# Patient Record
Sex: Female | Born: 1965
Health system: Western US, Academic
[De-identification: ages and names within clinical notes are randomized; demographics above are authoritative.]

---

## 2012-11-19 DIAGNOSIS — N390 Urinary tract infection, site not specified: Secondary | ICD-10-CM

## 2015-12-21 DIAGNOSIS — R61 Generalized hyperhidrosis: Secondary | ICD-10-CM

## 2017-05-01 ENCOUNTER — Ambulatory Visit: Payer: BLUE CROSS/BLUE SHIELD

## 2017-05-04 ENCOUNTER — Ambulatory Visit: Payer: BLUE CROSS/BLUE SHIELD

## 2017-05-04 DIAGNOSIS — M519 Unspecified thoracic, thoracolumbar and lumbosacral intervertebral disc disorder: Secondary | ICD-10-CM

## 2017-05-04 DIAGNOSIS — Z0000 Encounter for general adult medical examination without abnormal findings: Secondary | ICD-10-CM

## 2017-05-04 DIAGNOSIS — J309 Allergic rhinitis, unspecified: Secondary | ICD-10-CM

## 2017-05-04 DIAGNOSIS — R05 Cough: Secondary | ICD-10-CM

## 2017-05-04 MED ORDER — AZELASTINE-FLUTICASONE 137-50 MCG/ACT NA SUSP
1 | Freq: Two times a day (BID) | NASAL | 3 refills | Status: AC
Start: 2017-05-04 — End: 2018-12-02

## 2017-05-04 MED ORDER — AZITHROMYCIN 250 MG PO TABS
ORAL_TABLET | 0 refills | Status: AC
Start: 2017-05-04 — End: ?

## 2017-05-05 ENCOUNTER — Ambulatory Visit: Payer: BLUE CROSS/BLUE SHIELD

## 2017-05-05 ENCOUNTER — Telehealth: Payer: PRIVATE HEALTH INSURANCE

## 2017-05-05 ENCOUNTER — Telehealth: Payer: BLUE CROSS/BLUE SHIELD

## 2017-05-05 DIAGNOSIS — J321 Chronic frontal sinusitis: Secondary | ICD-10-CM

## 2017-05-06 ENCOUNTER — Ambulatory Visit: Payer: BLUE CROSS/BLUE SHIELD

## 2017-05-06 ENCOUNTER — Ambulatory Visit: Payer: PRIVATE HEALTH INSURANCE

## 2017-05-06 DIAGNOSIS — Z283 Underimmunization status: Secondary | ICD-10-CM

## 2017-05-06 MED ORDER — HYDROCODONE-ACETAMINOPHEN 5-325 MG PO TABS
1 | ORAL_TABLET | Freq: Four times a day (QID) | ORAL | 0 refills | Status: AC | PRN
Start: 2017-05-06 — End: ?

## 2017-05-15 ENCOUNTER — Telehealth: Payer: BLUE CROSS/BLUE SHIELD

## 2017-08-03 MED ORDER — MELOXICAM 15 MG PO TABS
ORAL_TABLET | 0 refills | Status: AC
Start: 2017-08-03 — End: 2018-12-30

## 2017-10-01 ENCOUNTER — Telehealth: Payer: PRIVATE HEALTH INSURANCE

## 2017-10-01 NOTE — Telephone Encounter
Forwarded by: Shalina Norfolk MURILLO RON

## 2017-10-01 NOTE — Telephone Encounter
Orders Request    What is being requested? (Tests, Labs, Imaging, etc.): [place]    Reason for the request: Per Christoper FabianGriselda in [facility] is requesting the right breast US  States that she is diagnostic  Pt is currently at the office    Where does the patient want to be seen? [facility]     [place], what is the fax number to the facility?    phone # [phone]  fax # [phone]      Has the patient seen their doctor for this matter? n    Last office visit: 05/04/2017    Patient was offered an appointment but declined.    Patient has been notified of the 24-48 hour turnaround time.

## 2017-10-07 ENCOUNTER — Ambulatory Visit: Payer: BLUE CROSS/BLUE SHIELD

## 2017-10-07 DIAGNOSIS — N63 Unspecified lump in unspecified breast: Secondary | ICD-10-CM

## 2017-10-07 NOTE — Telephone Encounter
HI Dr. Lewie ChamberHammer,     can you place the order so I can fax [facility]?

## 2017-10-07 NOTE — Telephone Encounter
Reply by: Helene Shoeerence M. Daron Stutz   breast US request done

## 2017-10-07 NOTE — Telephone Encounter
I called the pt and left her a voicemail. Order was fax to the fax number that was provided below. Thank you.    Fax# [phone]  @ 2:09pm  Total pgs: 2

## 2017-10-07 NOTE — Telephone Encounter
Reply by: Helene Shoeerence M. Jakeya Gherardi  Do we still need to approved us OF BRE[facility]>

## 2017-12-01 ENCOUNTER — Ambulatory Visit: Payer: PRIVATE HEALTH INSURANCE

## 2017-12-01 DIAGNOSIS — B001 Herpesviral vesicular dermatitis: Secondary | ICD-10-CM

## 2017-12-02 LAB — HSV Type 1 and 2 by PCR: HSV TYPE 1 DNA: NOT DETECTED

## 2017-12-02 MED ORDER — MOMETASONE FUROATE 0.1 % EX OINT
0 refills | Status: AC
Start: 2017-12-02 — End: 2018-12-25

## 2017-12-02 MED ORDER — VALACYCLOVIR HCL 500 MG PO TABS
500 mg | ORAL_TABLET | Freq: Two times a day (BID) | ORAL | 0 refills | Status: AC
Start: 2017-12-02 — End: ?

## 2017-12-02 NOTE — Progress Notes
PATIENTDeandrea Quinn  MRN: [id number]  DOB: 08/13/1966  DATE OF SERVICE: 12/01/2017    Chief Complaint   Patient presents with   ??? Mouth Lesions     Past Medical History:   Diagnosis Date   ??? Anxiety    ??? Degenerative arthritis        HPI:   Janice Quinn is a very pleasant 52 y.o. female with the above history who presents for the following:     Cold sore  -started 5 days ago   -painful, looked like a ''pus-filled pimple''. Difficult to floss    -was icing and applying OTC abrevia ointment which helped a little.   -no associated symptoms. Afebrile      CHRONIC CONDITIONS     1.  []  Stable  []  Improved  []  Worsened   2.  []  Stable  []  Improved  []  Worsened   3.   []  Stable  []  Improved  []  Worsened     Past History:     Past Medical History:   Diagnosis Date   ??? Anxiety    ??? Degenerative arthritis      Past Surgical History:   Procedure Laterality Date   ??? HYSTERECTOMY         Medications:     Current Outpatient Prescriptions   Medication Sig   ??? azelastine-fluticasone (DYMISTA) 137-50 mcg/act nasal spray 1 spray by Both Nares route two (2) times daily.   ??? gabapentin 300 mg capsule at bedtime.   ??? hydrocodone-acetaminophen 5-325 mg tablet Take 1 tablet by mouth every six (6) hours as needed for Mild Pain. Max Daily Amount: 4 tablets   ??? MELOXICAM 15 mg tablet TAKE 1 TABLET BY MOUTH DAILY AS NEEDED   ??? MOMETASONE 0.1% ointment APPLY EXTERNALLY TO THE AFFECTED AREA DAILY   ??? ALPRAZOLAM 0.25 mg tablet TAKE 1 TABLET BY MOUTH THREE TIMES DAILY AS NEEDED FOR ANXIETY (Patient not taking: Reported on 05/04/2017)   ??? azithromycin 250 mg tablet Take 2 tablets (500 mg) on  Day 1,  followed by 1 tablet (250 mg) once daily on Days 2 through 5.. (Patient not taking: Reported on 12/01/2017.)     No current facility-administered medications for this visit.      No Known Allergies    Family History:     Family History   Problem Relation Age of Onset   ??? Hypertension Mother    ??? Breast cancer Mother        Social History: Social History   Substance Use Topics   ??? Smoking status: Never Smoker   ??? Smokeless tobacco: Never Used   ??? Alcohol use No     Social History     Social History Narrative    Former smoker, in college for 2-3 years    Social drinking      OB History     No data available        No LMP recorded. Patient has had a hysterectomy.  History   Sexual Activity   ??? Sexual activity: Not on file       Review of Systems:   (Note + findings):     [x]  14-point ROS performed and is negative except as per HPI.    []  Not Obtainable:   []  Constit  []  Skin    []  Eyes  []  CV   []  MS    []  ENT  []  GI  []  Heme/Lymph     []  Resp  []   GU  []  Neuro    []  Imm/All   []  Endo  []  Psych       Physical Exam:   Check if Normal, or note + findings  Vit BP 107/79  ~ Pulse 61  ~ Temp 36.6 ???C (97.9 ???F) (Oral)  ~ Resp 18  ~ Ht 5' 5'' (1.651 m)  ~ Wt 160 lb (72.6 kg) Comment: per pt ~ SpO2 95%  ~ BMI 26.63 kg/m???    BP Readings from Last 3 Encounters:   12/01/17 107/79   05/04/17 108/76   12/05/16 125/85     Wt Readings from Last 3 Encounters:   12/01/17 160 lb (72.6 kg)   05/04/17 158 lb 3.2 oz (71.8 kg)   12/05/16 156 lb (70.8 kg)      Const [x]  NAD  GI []  Abd  []  Rect []  HSM   Eyes []  Conj/Lids []  Pupils  []  Masses []  Guard/Rebnd   ENMT []  Ears/otosc    []  Oroph    1cm erythematous papule on the L inferior lip, adjacent to the lateral commissure w/o pus or drainage   []  Nasal Muc  []  Hearing GU:Fem  KG:MWNU []  Ext   []  Vag      []  Pen  []  Scro    []  Cerv []  Uter/Ad []  Deferred  []  Prost []  Deferred   Neck []  Inspect/Palp   []  Thyroid Neuro [x]  A&O []  CN2-12   Breast []  Inspect   []  Palpation  []  DTR []  Motor/Sen   Resp [x]  Effort [x]  Ascult []  Percuss MSK [x]  Gait   []  ROM  []  Tone  []  Bal []  Insp/palp  []  Back   CV [x]  Auscultate []  Carotids       [x]  Edema Puls: [x]  Post Tib  Skin [x]  Inspect   []  Palp   Lymph []  Neck  []  Axillary  []   Femoral Psych [x]  Insight/judg  [x]  Affect [x]  Cog       Labs/Imaging: I have   [] reviewed radiology,  [x] reviewed labs, [] reviewed diag med test, [x] reviewed & summarized old records, [] requested outside medical records.    Lab Results   Component Value Date    WBC 5.92 12/14/2015    HGB 13.9 12/14/2015    HCT 42.3 12/14/2015    MCV 90.8 12/14/2015    PLT 277 12/14/2015     Lab Results   Component Value Date    CREAT 0.72 12/14/2015    BUN 17 12/14/2015    NA 144 12/14/2015    K 4.5 12/14/2015    CL 99 12/14/2015    CO2 26 12/14/2015     No results found for: HGBA1C  Lab Results   Component Value Date    ALT 13 12/14/2015    AST 23 12/14/2015    ALKPHOS 46 12/14/2015    BILITOT 0.5 12/14/2015     Lab Results   Component Value Date    TSH 1.7 12/14/2015     Lab Results   Component Value Date    CALCIUM 9.8 12/14/2015     No results found for: CHOL, CHOLHDL, CHOLDLCAL, CHOLDLQ, TRIGLY    Assessment and Plan:   Janice Quinn is a very pleasant 52 y.o. female with:     #Herpes Labialis - New   -valacyclovir 500mg  PO BID for 3 days   -counseled on stress reduction   -recommend lysine supplement QD   -f/u swab     There are no diagnoses linked to this  encounter.    Patient Active Problem List    Diagnosis Date Noted   ??? Breast lump in female 10/07/2017   ??? Immunization deficiency 05/06/2017   ??? Healthcare maintenance 05/04/2017   ??? Lumbar disc disease 05/04/2017   ??? Cough 05/04/2017   ??? Night sweats 12/21/2015   ??? Onychomycosis 12/14/2015   ??? Allergic rhinitis 10/17/2014   ??? Low back pain 02/16/2014     IMO Update     ??? ROM (right otitis media) 10/17/2013   ??? Sinusitis 11/19/2012   ??? Weight gain, abnormal 11/19/2012   ??? Lower urinary tract infectious disease 11/19/2012     IMO Update           Health Care Maintenance:   Health Maintenance   Topic Date Due   ??? HIV Screening  11/06/1984   ??? Cervical Ca Screening: HPV Testing  11/06/1996   ??? Cervical Ca Screening: PAP Smear  11/06/1996   ??? Breast Ca Screening: MAMMOGRAM  11/06/2006   ??? Annual Preventive Wellness Visit  11/06/2016 ??? Colon Ca Screening: COLONOSCOPY  11/06/2016   ??? Influenza Vaccine (1) 06/13/2017   ??? Tdap/Td Vaccine (2 - Td) 05/05/2027       Vaccinations   Immunization History   Administered Date(s) Administered   ??? Tdap 05/04/2017   ??? Typhoid Live, oral 05/04/2017       Cancer Screening  Colonoscopy ??? Age 68 (Current age: 52 y.o.)    Preventive Health  A1c ??? No results found for: HGBA1C  Lipids - 2013 ACC/AHA guidelines recommend recommends that patient is not in statin benefit group. Encourage adherence to heart-healthy lifestyle.    10-year ASCVD risk  cannot be calculated because at least one required variable is not available in [facility]  as of 4:33 PM on 12/01/2017  Values used to calculate ASCVD score:  Age: 52 y.o.   Gender: Female Race: Not African American.  Cannot calculate risk because HDL cholesterol not documented within the past 5 years.    Cannot calculate risk because Total cholesterol not documented within the past 5 years.    LDL cholesterol not documented within the past 5 years.    Systolic BP: 107 mm Hg. BP was measured today.  The patient is not being treated with a medication that influences SBP.  The patient is currently not a smoker.  The patient does not have a diagnosis of diabetes.  No results found for: CHOL, CHOLHDL, CHOLDLCAL, CHOLDLQ, TRIGLY  HIV:     Hepatitis C:     There are no Patient Instructions on file for this visit.    The above recommendation were discussed with the patient.  The patient has all questions answered satisfactorily and is in agreement with this recommended plan of care.    The above note was composed with the help of  [facility]    Author:  Kandis Mannan. Earnestene Angello, MD 12/01/2017

## 2017-12-02 NOTE — Patient Instructions
Lysine  - Solgar   Daily

## 2017-12-18 ENCOUNTER — Ambulatory Visit: Payer: BLUE CROSS/BLUE SHIELD

## 2017-12-18 ENCOUNTER — Telehealth: Payer: BLUE CROSS/BLUE SHIELD

## 2017-12-18 DIAGNOSIS — Z0000 Encounter for general adult medical examination without abnormal findings: Secondary | ICD-10-CM

## 2017-12-18 NOTE — Telephone Encounter
Please encourage an appt. Ok to [employer] for today.   Let her know its best to look at it and also we may draw some blood today to figure it out.     Thanks.   ms

## 2017-12-18 NOTE — Telephone Encounter
Forwarded by: Kailei Cowens MURILLO RON

## 2017-12-18 NOTE — Telephone Encounter
Pt is coming @ 4:30 pm today.

## 2017-12-18 NOTE — Telephone Encounter
Call Back Request    MD:  Dr. Sherrilee Gilles    Reason for call back: patient would like to speak with Dr. Sherrilee Gilles regarding her mouth sores. She stated she has another one on the other side of her lip.     Any Symptoms:  []  Yes  [x]  No      ? If yes, what symptoms are you experiencing:    o Duration of symptoms (how long):    o Have you taken medication for symptoms (OTC or Rx):      Patient or caller has been notified of the 24-48 hour turnaround time. yes

## 2017-12-20 DIAGNOSIS — L989 Disorder of the skin and subcutaneous tissue, unspecified: Secondary | ICD-10-CM

## 2017-12-21 ENCOUNTER — Ambulatory Visit: Payer: PRIVATE HEALTH INSURANCE

## 2017-12-21 NOTE — Progress Notes
PATIENTBerkeley Quinn  MRN: [id number]  DOB: 09-05-1966  DATE OF SERVICE: 12/18/2017    Chief Complaint   Patient presents with   ??? Mouth Lesions     Past Medical History:   Diagnosis Date   ??? Anxiety    ??? Degenerative arthritis        HPI:   Janice Quinn is a very pleasant 52 y.o. female with the above history who presents for the following:     Lesion near lip.     Prior healed on left now on right side.   -started 2 days ago   -painful, looked like a ''pus-filled pimple''. Difficult to floss    -no associated symptoms. Afebrile    No chest pain, shortness of breathe, dizziness, syncope, presyncope, abdominal pain, bowel or bladder dysfunction, headache, weakness.          CHRONIC CONDITIONS     1.  []  Stable  []  Improved  []  Worsened   2.  []  Stable  []  Improved  []  Worsened   3.   []  Stable  []  Improved  []  Worsened     Past History:     Past Medical History:   Diagnosis Date   ??? Anxiety    ??? Degenerative arthritis      Past Surgical History:   Procedure Laterality Date   ??? HYSTERECTOMY         Medications:     Current Outpatient Prescriptions   Medication Sig   ??? ALPRAZOLAM 0.25 mg tablet TAKE 1 TABLET BY MOUTH THREE TIMES DAILY AS NEEDED FOR ANXIETY (Patient not taking: Reported on 05/04/2017)   ??? azelastine-fluticasone (DYMISTA) 137-50 mcg/act nasal spray 1 spray by Both Nares route two (2) times daily.   ??? azithromycin 250 mg tablet Take 2 tablets (500 mg) on  Day 1,  followed by 1 tablet (250 mg) once daily on Days 2 through 5.. (Patient not taking: Reported on 12/01/2017.)   ??? gabapentin 300 mg capsule at bedtime.   ??? hydrocodone-acetaminophen 5-325 mg tablet Take 1 tablet by mouth every six (6) hours as needed for Mild Pain. Max Daily Amount: 4 tablets   ??? MELOXICAM 15 mg tablet TAKE 1 TABLET BY MOUTH DAILY AS NEEDED   ??? mometasone 0.1% ointment APPLY EXTERNALLY TO THE AFFECTED AREA DAILY.     No current facility-administered medications for this visit.      No Known Allergies    Family History: Family History   Problem Relation Age of Onset   ??? Hypertension Mother    ??? Breast cancer Mother        Social History:     Social History   Substance Use Topics   ??? Smoking status: Never Smoker   ??? Smokeless tobacco: Never Used   ??? Alcohol use No     Social History     Social History Narrative    Former smoker, in college for 2-3 years    Social drinking      OB History     No data available        No LMP recorded. Patient has had a hysterectomy.  History   Sexual Activity   ??? Sexual activity: Not on file       Review of Systems:   (Note + findings):     [x]  14-point ROS performed and is negative except as per HPI.    []  Not Obtainable:   []  Constit  []  Skin    []   Eyes  []  CV   []  MS    []  ENT  []  GI  []  Heme/Lymph     []  Resp  []  GU  []  Neuro    []  Imm/All   []  Endo  []  Psych       Physical Exam:   Check if Normal, or note + findings  Vit BP 137/89  ~ Pulse 57  ~ Temp 37.2 ???C (98.9 ???F) (Oral)  ~ Resp 16  ~ Ht 5' 5'' (1.651 m)  ~ Wt 160 lb (72.6 kg)  ~ SpO2 97%  ~ BMI 26.63 kg/m???    BP Readings from Last 3 Encounters:   12/18/17 137/89   12/01/17 107/79   05/04/17 108/76     Wt Readings from Last 3 Encounters:   12/18/17 160 lb (72.6 kg)   12/01/17 160 lb (72.6 kg)   05/04/17 158 lb 3.2 oz (71.8 kg)      Const [x]  NAD  GI []  Abd  []  Rect []  HSM   Eyes []  Conj/Lids []  Pupils  []  Masses []  Guard/Rebnd   ENMT []  Ears/otosc    []  Oroph    Healing lesion. , adjacent to the lateral commissure w/o pus or drainage   []  Nasal Muc  []  Hearing GU:Fem  ZO:XWRU []  Ext   []  Vag      []  Pen  []  Scro    []  Cerv []  Uter/Ad []  Deferred  []  Prost []  Deferred   Neck []  Inspect/Palp   []  Thyroid Neuro [x]  A&O []  CN2-12   Breast []  Inspect   []  Palpation  []  DTR []  Motor/Sen   Resp [x]  Effort [x]  Ascult []  Percuss MSK [x]  Gait   []  ROM  []  Tone  []  Bal []  Insp/palp  []  Back   CV [x]  Auscultate []  Carotids       [x]  Edema Puls: [x]  Post Tib  Skin [x]  Inspect   []  Palp Lymph []  Neck  []  Axillary  []   Femoral Psych [x]  Insight/judg  [x]  Affect [x]  Cog       Labs/Imaging:   I have   [] reviewed radiology,  [x] reviewed labs, [] reviewed diag med test, [x] reviewed & summarized old records, [] requested outside medical records.    Lab Results   Component Value Date    WBC 5.92 12/14/2015    HGB 13.9 12/14/2015    HCT 42.3 12/14/2015    MCV 90.8 12/14/2015    PLT 277 12/14/2015     Lab Results   Component Value Date    CREAT 0.72 12/14/2015    BUN 17 12/14/2015    NA 144 12/14/2015    K 4.5 12/14/2015    CL 99 12/14/2015    CO2 26 12/14/2015     No results found for: HGBA1C  Lab Results   Component Value Date    ALT 13 12/14/2015    AST 23 12/14/2015    ALKPHOS 46 12/14/2015    BILITOT 0.5 12/14/2015     Lab Results   Component Value Date    TSH 1.7 12/14/2015     Lab Results   Component Value Date    CALCIUM 9.8 12/14/2015     No results found for: CHOL, CHOLHDL, CHOLDLCAL, CHOLDLQ, TRIGLY    Assessment and Plan:   Janice Quinn is a very pleasant 52 y.o. female with:     Angular chelitis:   Explained causes.   Will check labs.     Diagnoses and all orders for  this visit:    Skin lesion    Routine adult health maintenance  -     CBC & Auto Differential; Future  -     Comprehensive Metabolic Panel; Future  -     TSH; Future  -     Free T4; Future  -     Hgb A1c; Future  -     Vitamin D,25-Hydroxy; Future  -     Vitamin B12; Future  -     Folate,Serum; Future  -     CBC  -     Differential, Automated  -     Comprehensive Metabolic Panel; Future  -     Folate,Serum; Future  -     Free T4; Future  -     TSH; Future  -     Vitamin B12; Future  -     Vitamin D 25-OH; COMMON, deficiency status; Future  -     Hgb A1c - HPLC; Future  -     CBC & Auto Differential; Future        Patient Active Problem List    Diagnosis Date Noted   ??? Breast lump in female 10/07/2017   ??? Immunization deficiency 05/06/2017   ??? Healthcare maintenance 05/04/2017   ??? Lumbar disc disease 05/04/2017 ??? Cough 05/04/2017   ??? Night sweats 12/21/2015   ??? Onychomycosis 12/14/2015   ??? Allergic rhinitis 10/17/2014   ??? Low back pain 02/16/2014     IMO Update     ??? ROM (right otitis media) 10/17/2013   ??? Sinusitis 11/19/2012   ??? Weight gain, abnormal 11/19/2012   ??? Lower urinary tract infectious disease 11/19/2012     IMO Update           Health Care Maintenance:   Health Maintenance   Topic Date Due   ??? HIV Screening  11/06/1984   ??? Cervical Ca Screening: HPV Testing  11/06/1996   ??? Cervical Ca Screening: PAP Smear  11/06/1996   ??? Breast Ca Screening: MAMMOGRAM  11/06/2006   ??? Shingles (Shingrix) Vaccine (1 of 2) 11/06/2016   ??? Annual Preventive Wellness Visit  11/06/2016   ??? Colon Ca Screening: COLONOSCOPY  11/06/2016   ??? Influenza Vaccine (1) 06/13/2017   ??? Tdap/Td Vaccine (2 - Td) 05/05/2027       Vaccinations   Immunization History   Administered Date(s) Administered   ??? Tdap 05/04/2017   ??? Typhoid Live, oral 05/04/2017       Cancer Screening  Colonoscopy ??? Age 30 (Current age: 52 y.o.)    Preventive Health  A1c ??? No results found for: HGBA1C  Lipids - 2013 ACC/AHA guidelines recommend recommends that patient is not in statin benefit group. Encourage adherence to heart-healthy lifestyle.    10-year ASCVD risk  cannot be calculated because at least one required variable is not available in [facility]  as of 10:38 PM on 12/20/2017  Values used to calculate ASCVD score:  Age: 52 y.o.   Gender: Female Race: Not African American.  Cannot calculate risk because HDL cholesterol not documented within the past 5 years.    Cannot calculate risk because Total cholesterol not documented within the past 5 years.    LDL cholesterol not documented within the past 5 years.    Systolic BP: 137 mm Hg. BP was measured 2 days ago.  The patient is not being treated with a medication that influences SBP.  The patient is currently not a smoker.  The patient does not have a diagnosis of diabetes. No results found for: CHOL, CHOLHDL, CHOLDLCAL, CHOLDLQ, TRIGLY  HIV:     Hepatitis C:     There are no Patient Instructions on file for this visit.    The above recommendation were discussed with the patient.  The patient has all questions answered satisfactorily and is in agreement with this recommended plan of care.        Author:  Kandis Mannan. Marisah Laker, MD 12/20/2017

## 2017-12-25 ENCOUNTER — Ambulatory Visit: Payer: BLUE CROSS/BLUE SHIELD

## 2017-12-25 ENCOUNTER — Telehealth: Payer: BLUE CROSS/BLUE SHIELD

## 2017-12-25 DIAGNOSIS — L989 Disorder of the skin and subcutaneous tissue, unspecified: Secondary | ICD-10-CM

## 2018-06-12 DIAGNOSIS — Z90710 Acquired absence of both cervix and uterus: Secondary | ICD-10-CM

## 2018-06-12 DIAGNOSIS — N83299 Other ovarian cyst, unspecified side: Secondary | ICD-10-CM

## 2018-12-01 MED ORDER — DYMISTA 137-50 MCG/ACT NA SUSP
3 refills | Status: AC
Start: 2018-12-01 — End: 2018-12-28

## 2018-12-21 ENCOUNTER — Telehealth: Payer: BLUE CROSS/BLUE SHIELD

## 2018-12-21 ENCOUNTER — Ambulatory Visit: Payer: BLUE CROSS/BLUE SHIELD

## 2018-12-21 DIAGNOSIS — J309 Allergic rhinitis, unspecified: Secondary | ICD-10-CM

## 2018-12-21 DIAGNOSIS — I517 Cardiomegaly: Secondary | ICD-10-CM

## 2018-12-21 DIAGNOSIS — M545 Low back pain: Secondary | ICD-10-CM

## 2018-12-21 DIAGNOSIS — T733XXD Exhaustion due to excessive exertion, subsequent encounter: Secondary | ICD-10-CM

## 2018-12-21 NOTE — Progress Notes
OUTPATIENT PROGRESS NOTE    PATIENT: Janice Quinn  MRN: [id number]  DOB: February 08, 1966  DATE OF SERVICE: 12/21/2018    PRIMARY CARE PROVIDER: Elliot Gault., MD    CHIEF COMPLAINT  Chief Complaint   Patient presents with   ??? Annual Exam        HISTORY OF PRESENT ILLNESS      Janice Quinn is a 53 y.o. female who presents for an annual physical exam:    Health maintenance:    Situational Work stress: Pt notes in September 2019 had emergency surgery for removal of an ovarian cyst. She reports not feeling like herself since then. She was out of work for 8 weeks, and upon her return she did not have any support causing her to work 18+hrs a day. Notes she finally hired someone and found out they were changing her position. She is inquiring about possible perimenopause s/p surgery, or attributes amenorrhea due to work related stress. Admits intermittent earaches, and sore throat.     Mammogram December 2019- due to cysts- ''fine per pt''  Cervical Pap smear 07/2018  HIV- deferred    # EKG Reviewed  EKG performed on 12/21/2018, and personally reviewed shows: Sinus Bradycardia, Minimal Voltage criteria for LVH, may be normal variant  - EKG results reviewed with pt.         REVIEW OF SYSTEMS   (Check if unchanged, or note how changed)     [x]  Constitutional  [x]  Mental Status  [x]  Mood    [x]  Eyes  [x]  ENT  [x]  Cardiovascular    [x]  Resp  [x]  GI  [x]  GU    [x]  Neuro  [x]  Endo  []  Heme/Lymph    [x]  Musculoskeletal   [x]  Gait/balance  [x]  Cognition    [x]  Allergy/Immune  [x]  Skin       CHRONIC CONDITIONS  1.  []  Improved  []  Worsened  []  Stable   2.  []  Improved  []  Worsened  []  Stable   3.   []  Improved  []  Worsened  []  Stable     PAST MEDICAL HISTORY   Past Medical History:   Diagnosis Date   ??? Anxiety    ??? Degenerative arthritis        ALLERGIES  No Known Allergies    MEDICATIONS  Medications that the patient states to be currently taking   Medication Sig ??? DYMISTA 137-50 MCG/ACT nasal spray SHAKE LIQUID AND USE 1 SPRAY IN EACH NOSTRIL TWICE DAILY   ??? gabapentin 300 mg capsule at bedtime.   ??? MELOXICAM 15 mg tablet TAKE 1 TABLET BY MOUTH DAILY AS NEEDED   ??? mometasone 0.1% ointment APPLY EXTERNALLY TO THE AFFECTED AREA DAILY.       PERTINENT SOCIAL HISTORY  Social History     Socioeconomic History   ??? Marital status: Married     Spouse name: Not on file   ??? Number of children: Not on file   ??? Years of education: Not on file   ??? Highest education level: Not on file   Occupational History   ??? Not on file   Social Needs   ??? Financial resource strain: Not on file   ??? Food insecurity:     Worry: Not on file     Inability: Not on file   ??? Transportation needs:     Medical: Not on file     Non-medical: Not on file   Tobacco Use   ??? Smoking status: Never  Smoker   ??? Smokeless tobacco: Never Used   Substance and Sexual Activity   ??? Alcohol use: No   ??? Drug use: No   ??? Sexual activity: Not on file   Lifestyle   ??? Physical activity:     Days per week: Not on file     Minutes per session: Not on file   ??? Stress: Not on file   Relationships   ??? Social connections:     Talks on phone: Not on file     Gets together: Not on file     Attends religious service: Not on file     Active member of club or organization: Not on file     Attends meetings of clubs or organizations: Not on file     Relationship status: Not on file   Other Topics Concern   ??? Not on file   Social History Narrative    Former smoker, in college for 2-3 years    Social drinking       PHYSICAL EXAM (Check if Normal, or note positive findings)  BP 140/94  ~ Pulse 59  ~ Temp 36.1 ???C (96.9 ???F) (Oral)  ~ Resp 17  ~ Ht 5' 5'' (1.651 m)  ~ Wt 160 lb 12.8 oz (72.9 kg)  ~ SpO2 99%  ~ BMI 26.76 kg/m???   Vitals:    12/21/18 0839   Weight: 160 lb 12.8 oz (72.9 kg)   Height: 5' 5'' (1.651 m)      Body mass index is 26.76 kg/m???.    System Check if Normal Positive or additional negative findings   Constit  [x]  General appearance Eyes  [x]  Conj/Lids [x]  Pupils  [x]  Fundi     ENMT  [x]  External ears/nose [x]  Otoscopy   [x]  Hearing [x]  Nasal mucosa   [x]  Lips/teeth/gums [x]  Oropharynx     Neck  [x]  Inspection/palpation [x]  Thyroid     Resp  [x]  Effort [x]  Percussion   [x]  Auscultation     CV  [x]  Auscultation [x]  Lower extremities    [x]  Abd aorta [x]  Femoral  [x]  Pedal     Breast  []  Inspection []  Palpation     GI  [x]  Abd (masses or tenderness)   [x]  Liver/spleen []  Rectal     GU  M: []  Scrotum []  Penis []  Prostate   F:  []  External []  Bladder []  Cervix        []  Uterus []  Adnexa      Lymph  [x]  Neck [x]  Axillae []  Groin     MS  [x]  Gait [x]  Digits/nails  Specify site examined:    [x]  Inspect/palp [x]  ROM   [x]  Stability []  Strength/tone         Skin  [x]  Inspection [x]  Palpation     Neuro  [x]  CN2-12 [x]  DTRs  [x]  Sensation  bilateral vibratory sense inact   Psych  [x]  Insight/judgement [x]  Orientation   [x]  Memory [x]  Mood/affect           ASSESSMENT AND PLAN  Diagnoses and all orders for this visit:    Healthcare maintenance  -     Referral for Colonoscopy Procedure  -     Tdap vaccine > or = to 52yo IM  -     Zoster Acc Recombinant Adjuvanted (Shingrix)    Lumbar disc disease    Immunization deficiency  -     [place] abd aorta screening non-vascular; Future    History of  hysterectomy  -     Urinalysis,Routine; Future  -     Uric Acid; Future  -     Bacterial Culture Urine, Clean Catch; Future    LVH (left ventricular hypertrophy)  -     CBC; Future  -     Comprehensive Metabolic Panel; Future  -     ECG 12 lead  -     Free T4; Future  -     Hgb A1c; Future  -     HCV Antibody Screen; Future  -     hsCRP (Cardio CRP) and CV Risk; Future  -     Iron & Iron Binding Capacity; Future  -     TSH; Future  -     Thyroid Peroxidase Antibody; Future  -     Sedimentation Rate, Erythrocyte; Future  -     Lipid Panel; Future    Fatigue due to excessive exertion, subsequent encounter  -     CBC; Future         Healthcare Maintenance  Health Maintenance Topic Date Due   ??? Shingles (Shingrix) Vaccine (1 of 2) 11/06/2016   ??? Colon Ca Screening: COLONOSCOPY  11/06/2016   ??? Influenza Vaccine (1) 06/13/2018   ??? Annual Preventive Wellness Visit  12/21/2019   ??? Breast Ca Screening: MAMMOGRAM  09/24/2020   ??? Cervical Ca Screening: HPV Testing  07/30/2023   ??? Cervical Ca Screening: PAP Smear  07/30/2023   ??? Tdap/Td Vaccine (2 - Td) 05/05/2027   ??? HIV Screening  Discontinued       Immunizations   Immunization History   Administered Date(s) Administered   ??? Tdap 05/04/2017   ??? Typhoid Live, oral 05/04/2017        FOLLOW UP  No follow-ups on file.     Today's visit lasted more than 60 minutes of which greater than 50% was spent in counseling, coordination of care and a detailed question and answer session regarding the pathophysiology, etiology, and treatment options available including the risks and benefits related to the medical issues pertinent to this encounter.  The date of encounter was when the patient was seen and does not reflect when the note was signed. The above plan of care, diagnosis, orders, and follow-up were discussed with the patient.  Questions related to this recommended plan of care were answered.      Scribe Signature:  ICorlis Leak, have scribed for Dr. Lewie Chamber with the documentation for [facility] Tallie [facility] [place] on 12/21/2018 at 9:25 AM.    Performing provider statement: ''I Dr. Nils Pyle. Lewie Chamber, personally performed the services described in this documentation, as scribed by Corlis Leak in my presence, and it is both accurate and complete.''  Wende Mott, MD  12/21/2018 9:25 AM

## 2018-12-21 NOTE — Telephone Encounter
Message to Practice/Provider    MD: Dr Lewie Chamber     Message: Pt is requesting for her EKG that was performed today to be mailed to her home address.  Please make results available on her My [facility] Portal as well.  Thank you!    Return call is not being requested by the patient or caller.    Patient or caller has been notified of the 24-48 hour processing turnaround time if applicable.

## 2018-12-22 LAB — hsCRP (Cardio CRP) and CV Risk: HSCRP (CARDIO CRP) AND CV RISK: 0.7 mg/L

## 2018-12-22 LAB — Comprehensive Metabolic Panel
BILIRUBIN,TOTAL: 0.3 mg/dL (ref 0.1–1.2)
GFR ESTIMATE FOR NON-AFRICAN AMERICAN: >89 mL/min/{1.73_m2} (ref 13–47)

## 2018-12-22 LAB — Lipid Panel: CHOLESTEROL, HDL: 70 mg/dL (ref >50)

## 2018-12-22 LAB — TSH: TSH: 1.6 u[IU]/mL (ref 0.3–4.7)

## 2018-12-22 LAB — Hgb A1c: HGB A1C - HPLC: 5.5 (ref <5.7)

## 2018-12-22 LAB — Sedimentation Rate, Erythrocyte: SEDIMENTATION RATE, ERYTHROCYTE: 18 mm/h (ref <=25)

## 2018-12-22 LAB — CBC: RED CELL DISTRIBUTION WIDTH-SD: 40.9 fL (ref 36.9–48.3)

## 2018-12-22 LAB — Extra Gold Top

## 2018-12-22 LAB — Thyroid Peroxidase Antibody: THYROID PEROXIDASE ANTIBODY: 59 [IU]/mL — ABNORMAL HIGH (ref <=20)

## 2018-12-22 LAB — Uric Acid: URIC ACID: 5.1 mg/dL (ref 2.9–7.0)

## 2018-12-22 LAB — UA,Microscopic: RBCS HPF: 0 {cells}/[HPF] (ref 0–2)

## 2018-12-22 LAB — UA,Dipstick: PROTEIN: NEGATIVE (ref 1.005–1.030)

## 2018-12-22 LAB — Free T4: FREE T4: 1.3 ng/dL (ref 0.8–1.7)

## 2018-12-22 LAB — HCV Ab Screen: HCV ANTIBODY SCREEN: NONREACTIVE

## 2018-12-22 LAB — Iron & Iron Binding Capacity: IRON: 87 ug/dL (ref 41–179)

## 2018-12-22 NOTE — Addendum Note
Addended by: Zachary George on: 12/22/2018 12:58 AM     Modules accepted: Orders

## 2018-12-23 LAB — Bacterial Culture Urine: BACTERIAL CULTURE URINE: NO GROWTH {titer}

## 2018-12-24 MED ORDER — MOMETASONE FUROATE 0.1 % EX OINT
0 refills | Status: AC
Start: 2018-12-24 — End: ?

## 2018-12-24 NOTE — Telephone Encounter
ECG mailed to patients address on file

## 2018-12-28 ENCOUNTER — Telehealth: Payer: BLUE CROSS/BLUE SHIELD

## 2018-12-28 MED ORDER — AZELASTINE-FLUTICASONE 137-50 MCG/ACT NA SUSP
1 | Freq: Two times a day (BID) | NASAL | 3 refills | Status: AC
Start: 2018-12-28 — End: 2019-01-14

## 2018-12-28 NOTE — Telephone Encounter
Results Request - The patient would like to discuss the results of their recent tests.     1) Ordering MD: Lewie Chamber    2) What type of test(s)? ECG    3) When was it performed? 12/21/2018    4) Where was it performed? [facility]    Per patient please release results to mychart     If [place], are results available in [facility]? yes  If outside facility, what is their phone number? n/a    Patient has been notified of the 24-48 hour turnaround time.

## 2018-12-28 NOTE — Telephone Encounter
Medication Prior Authorization    Name of medication: DYMISTA    Prescribing MD: Lewie Chamber    ? Prescribed date: 12/27/2018    Is there an alternative covered medication? N/a     Has patient taken any other medications for this matter? No    Insurance company phone number: N/a    Administrator, arts number:  Not provided  Patient states pharmacy notified her she would need a PA     Patient has been notified of the 24-48 hour turnaround time.

## 2018-12-30 MED ORDER — MELOXICAM 15 MG PO TABS
15 mg | ORAL_TABLET | Freq: Every day | ORAL | 1 refills | Status: AC
Start: 2018-12-30 — End: ?

## 2019-01-06 ENCOUNTER — Ambulatory Visit: Payer: BLUE CROSS/BLUE SHIELD

## 2019-01-13 ENCOUNTER — Telehealth: Payer: PRIVATE HEALTH INSURANCE

## 2019-01-13 NOTE — Telephone Encounter
Good Morning,    Call Back Request    MD: Dr. Wende Mott    Reason for call back:  Patient called requesting to know if she will be having any Additional Testing completed during her Appointment tomorrow-01/14/2019 at 9:00 am with Dr. Wende Mott, and if she should keep the Appointment. Patient is also requesting to confirm how often she should be getting the Shingles Vaccination, and is requesting a return call to discuss further.    Any Symptoms:  []  Yes  []  No Information Not Provided-Patient had to disconnect call    ? If yes, what symptoms are you experiencing: Information Not Provided-Patient had to disconnect call  o Duration of symptoms (how long): Information Not Provided-Patient had to disconnect call    o Have you taken medication for symptoms (OTC or Rx): Information Not Provided-Patient had to disconnect call       Patient or caller has been notified of the 24-48 hour turnaround time.

## 2019-01-13 NOTE — Progress Notes
OUTPATIENT PROGRESS NOTE    PATIENT: Janice Quinn  MRN: [id number]  DOB: 16-Sep-1966  DATE OF SERVICE: 01/14/2019    PRIMARY CARE PROVIDER: Elliot Gault., MD    CHIEF COMPLAINT  Chief Complaint   Patient presents with   ??? Follow-up     labs and [place]   ??? pre auth for dust mites        HISTORY OF PRESENT ILLNESS      Janice Quinn is a 53 y.o. female who presents for:     Situational Stress: pt notes she hasn't cried in two weeks, has been sleeping better. She changed jobs. The stress is manageable per pt.     Pt c/o otalgia. On examination ROM.     Fbs: 104  HgbA1C: 5.5  No DM    EGFR: >89 no CKD    WBC: low normal, no leukemia  RBC: no anemia  Hgb: 14.5    Liver function's WNL    UA normal     Hashimoto's thyroiditis: 59.0    - Pt notes she has increased red meat intake due to lack of poultry in stores.    Total Cholesterol: 201    Framingham ratio: 2.87  HDL: 70  LDL: 122  Triglycerides: 47  -Spoke with patient about the correlation between Framingham ratio (Total Cholesterol/HDL) and heart disease. Suggested a more healthy diet and more exercise, especially walking after dinner.     2018 ACC/AHA guidelines recommends that patient is not in statin benefit group. Encourage adherence to heart-healthy lifestyle.    10-year ASCVD risk  is 1.0%  as of 9:44 AM on 01/14/2019  10-year ASCVD risk with optimal risk factors is 0.8%.  Values used to calculate ASCVD score:  Age: 53 y.o.   Gender: Female Race: Not African American.  HDL cholesterol: 70 mg/dL. HDL cholesterol measured 24 days ago.  Total cholesterol: 201 mg/dL. Total cholesterol measured 24 days ago.  LDL cholesterol: 122 mg/dL. LDL cholesterol measured 24 days ago.  Systolic BP: 120 mm Hg. BP was measured today.  The patient is not being treated with a medication that influences SBP.  The patient is currently not a smoker.  The patient does not have a diagnosis of diabetes.      Pt mentions when she is lying down she feels a burning sensation in mid epigastric region.     US abdominal aorta screening non vascular 12/31/2018    IMPRESSION:  ???  No evidence of abdominal aortic aneurysm.    12/21/2018    Health maintenance:  ???  Situational Work stress: Pt notes in September 2019 had emergency surgery for removal of an ovarian cyst. She reports not feeling like herself since then. She was out of work for 8 weeks, and upon her return she did not have any support causing her to work 18+hrs a day. Notes she finally hired someone and found out they were changing her position. She is inquiring about possible perimenopause s/p surgery, or attributes amenorrhea due to work related stress. Admits intermittent earaches, and sore throat.     Mammogram December 2019- due to cysts- ''fine per pt''  Cervical Pap smear 07/2018  HIV- deferred    Fmhx: father had gout  ???  # EKG Reviewed  EKG performed on 12/21/2018, and personally reviewed shows: Sinus Bradycardia, Minimal Voltage criteria for LVH, may be normal variant  - EKG results reviewed with pt.     Labs reviewed:    Component  Latest Ref Rng & Units 12/21/2018   Sodium      135 - 146 mmol/L 142   Potassium      3.6 - 5.3 mmol/L 4.4   Chloride      96 - 106 mmol/L 102   Total CO2      20 - 30 mmol/L 24   Anion Gap      8 - 19 mmol/L 16   Glucose      65 - 99 mg/dL 161 (H)   GFR Est.for Non-African Americ      See GFR Additional Information mL/min/1.74m2 >89   GFR Est.for African American      See GFR Additional Information mL/min/1.74m2 >89   GFR Additional Information       See Comment   Creatinine      0.60 - 1.30 mg/dL 0.96   Urea Nitrogen      7 - 22 mg/dL 15   Calcium      8.6 - 10.4 mg/dL 04.5   TOTAL PROTEIN      6.1 - 8.2 g/dL 7.9   Albumin      3.9 - 5.0 g/dL 4.6   Bilirubin,Total      0.1 - 1.2 mg/dL 0.3   Alkaline Phosphatase      37 - 113 U/L 42   AST (SGOT)      13 - 47 U/L 20   ALT (SGPT)      8 - 64 U/L 10   White Blood Cell Count      4.16 - 9.95 x10E3/uL 4.24   Red Blood Cell Count 3.96 - 5.09 x10E6/uL 4.90   Hemoglobin      11.6 - 15.2 g/dL 40.9   Hematocrit      34.9 - 45.2 % 45.5 (H)   Mean Corpuscular Volume      79.3 - 98.6 fL 92.9   Mean Corpuscular Hemoglobin      26.4 - 33.4 pg 29.6   MCH Concentration      31.5 - 35.5 g/dL 81.1   Red Cell Distribution Width-SD      36.9 - 48.3 fL 40.9   Red Cell Distribution Width-CV      11.1 - 15.5 % 11.9   Platelet Count, Auto      143 - 398 x10E3/uL 309   Mean Platelet Volume      9.3 - 13.0 fL 9.6   Nucleated RBC%, automated      No Ref. Range % 0.0   Absolute Nucleated RBC Count      0.00 - 0.00 x10E3/uL 0.00   Urine Color       Straw   Specific Gravity      1.005 - 1.030 1.005   pH,Urine      5.0 - 8.0 6.0   Blood,Dipstick      Negative Negative   Bili,Dipstick      Negative Negative   Ketones      Negative Negative   Glucose,Random Urine      Negative Negative   Protein      Negative Negative   Leukocyte Esterase      Negative Negative   Nitrite      Negative Negative   Cholesterol      See Comment mg/dL 914   Cholesterol,LDL,Calc      <100 mg/dL 782 (H)   Cholesterol, HDL      >50 mg/dL 70   Triglycerides      <150  mg/dL 47   Non-HDL,Chol,Calc      <130 mg/dL 161 (H)   RBC per uL      0 - 11 cells/uL 0   WBC per uL      0 - 22 cells/uL 1   RBC per HPF      0 - 2 cells/HPF 0   WBC per HPF      0 - 4 cells/HPF 0   Squamous Epi Cells      0 - 17 cells/uL 0   IRON      41 - 179 mcg/dL 87   Iron Binding Cap.      262 - 502 mcg/dL 096   % Saturation      % 28   Sedimentation rate, Erythrocyte      <=25 mm/hr 18   Thyroid Peroxidase Antibody      <=20 IU/mL 59.0 (H)   TSH      0.3 - 4.7 mcIU/mL 1.6   Uric Acid      2.9 - 7.0 mg/dL 5.1   hsCRP (Cardio CRP) and CV Risk      mg/L 0.7   HCV Antibody Screen      Nonreactive Nonreactive   Hemoglobin A1C      <5.7 % 5.5   Free T4      0.8 - 1.7 ng/dL 1.3   Bacterial Culture Urine       No Growth at 1:1000 dilution       REVIEW OF SYSTEMS   (Check if unchanged, or note how changed) [x]  Constitutional  [x]  Mental Status  [x]  Mood    [x]  Eyes  [x]  ENT  [x]  Cardiovascular    [x]  Resp  [x]  GI  [x]  GU    [x]  Neuro  [x]  Endo  []  Heme/Lymph    [x]  Musculoskeletal   [x]  Gait/balance  [x]  Cognition    [x]  Allergy/Immune  [x]  Skin       CHRONIC CONDITIONS  1.  []  Improved  []  Worsened  []  Stable   2.  []  Improved  []  Worsened  []  Stable   3.   []  Improved  []  Worsened  []  Stable     PAST MEDICAL HISTORY   Past Medical History:   Diagnosis Date   ??? Anxiety    ??? Degenerative arthritis        ALLERGIES  Allergies   Allergen Reactions   ??? Dust Mite Extract        MEDICATIONS  Medications that the patient states to be currently taking   Medication Sig   ??? azelastine-fluticasone (DYMISTA) 137-50 mcg/act nasal spray 1 spray by Both Nares route two (2) times daily.   ??? meloxicam 15 mg tablet Take 1 tablet (15 mg total) by mouth daily.   ??? MOMETASONE 0.1% ointment APPLY EXTERNALLY TO THE AFFECTED AREA DAILY       PERTINENT SOCIAL HISTORY  Social History     Socioeconomic History   ??? Marital status: Married     Spouse name: Not on file   ??? Number of children: Not on file   ??? Years of education: Not on file   ??? Highest education level: Not on file   Occupational History   ??? Not on file   Social Needs   ??? Financial resource strain: Not on file   ??? Food insecurity:     Worry: Not on file     Inability: Not on file   ??? Transportation needs:  Medical: Not on file     Non-medical: Not on file   Tobacco Use   ??? Smoking status: Never Smoker   ??? Smokeless tobacco: Never Used   Substance and Sexual Activity   ??? Alcohol use: No   ??? Drug use: No   ??? Sexual activity: Not on file   Lifestyle   ??? Physical activity:     Days per week: Not on file     Minutes per session: Not on file   ??? Stress: Not on file   Relationships   ??? Social connections:     Talks on phone: Not on file     Gets together: Not on file     Attends religious service: Not on file     Active member of club or organization: Not on file Attends meetings of clubs or organizations: Not on file     Relationship status: Not on file   Other Topics Concern   ??? Not on file   Social History Narrative    Former smoker, in college for 2-3 years    Social drinking       PHYSICAL EXAM (Check if Normal, or note positive findings)  BP 120/90  ~ Pulse 56  ~ Temp 36.8 ???C (98.3 ???F) (Oral)  ~ Resp 16  ~ Ht 5' 5'' (1.651 m)  ~ Wt 162 lb 12.8 oz (73.8 kg)  ~ SpO2 96%  ~ BMI 27.09 kg/m???   Vitals:    01/14/19 0917   Weight: 162 lb 12.8 oz (73.8 kg)   Height: 5' 5'' (1.651 m)      Body mass index is 27.09 kg/m???.    System Check if Normal Positive or additional negative findings   Constit  [x]  General appearance     Eyes  [x]  Conj/Lids [x]  Pupils  [x]  Fundi     ENMT  [x]  External ears/nose [x]  Otoscopy   [x]  Hearing [x]  Nasal mucosa   [x]  Lips/teeth/gums [x]  Oropharynx  ROM   Neck  [x]  Inspection/palpation [x]  Thyroid     Resp  [x]  Effort [x]  Percussion   [x]  Auscultation     CV  [x]  Auscultation [x]  Lower extremities    [x]  Abd aorta [x]  Femoral  [x]  Pedal     Breast  []  Inspection []  Palpation     GI  [x]  Abd (masses or tenderness)   [x]  Liver/spleen []  Rectal     GU  M: []  Scrotum []  Penis []  Prostate   F:  []  External []  Bladder []  Cervix        []  Uterus []  Adnexa      Lymph  [x]  Neck [x]  Axillae []  Groin     MS  [x]  Gait [x]  Digits/nails  Specify site examined:    [x]  Inspect/palp [x]  ROM   [x]  Stability []  Strength/tone         Skin  [x]  Inspection [x]  Palpation     Neuro  [x]  CN2-12 [x]  DTRs  [x]  Sensation     Psych  [x]  Insight/judgement [x]  Orientation   [x]  Memory [x]  Mood/affect         Recent Results (from the past 8760 hours)   [place] abd aorta screening non-vascular [[id number]]    Status: Normal 12/31/2018 10:36 AM    Impression IMPRESSION:    No evidence of abdominal aortic aneurysm.    I, Rinat Masamed, M.D., have reviewed this radiological study personally and I am in full agreement with the findings of the report  presented here. Dictated by: Irving Burton Jun   12/31/2018 9:04 AM    Signed by: Fritzi Mandes   12/31/2018 10:36 AM     ASSESSMENT AND PLAN  Diagnoses and all orders for this visit:    Hashimoto's thyroiditis    Family history of gout    Right otitis media, unspecified otitis media type         Healthcare Maintenance  Health Maintenance   Topic Date Due   ??? Colon Ca Screening: COLONOSCOPY  11/06/2016   ??? Shingles (Shingrix) Vaccine (2 of 2) 02/15/2019   ??? Influenza Vaccine (Season Ended) 06/14/2019   ??? Annual Preventive Wellness Visit  12/21/2019   ??? Breast Ca Screening: MAMMOGRAM  09/24/2020   ??? Cervical Ca Screening: HPV Testing  07/30/2023   ??? Cervical Ca Screening: PAP Smear  07/30/2023   ??? Tdap/Td Vaccine (3 - Td) 12/20/2028   ??? HIV Screening  Discontinued       Immunizations   Immunization History   Administered Date(s) Administered   ??? Tdap 05/04/2017, 12/21/2018   ??? Typhoid Live, oral 05/04/2017   ??? zoster vac recomb adjuvanted (Shingrix) 12/21/2018        FOLLOW UP  No follow-ups on file.     Today's visit lasted more than *** minutes of which greater than 50% was spent in counseling, coordination of care and a detailed question and answer session regarding the pathophysiology, etiology, and treatment options available including the risks and benefits related to the medical issues pertinent to this encounter.  The date of encounter was when the patient was seen and does not reflect when the note was signed. The above plan of care, diagnosis, orders, and follow-up were discussed with the patient.  Questions related to this recommended plan of care were answered.      Scribe Signature:  ICorlis Leak, have scribed for Dr. Lewie Chamber with the documentation for Alisson Dineen in [facility] in [place] on 01/14/2019 at 9:44 AM.    Performing provider statement: ''I Dr. Nils Pyle. Lewie Chamber, personally performed the services described in this documentation, as scribed by Corlis Leak in my presence, and it is both accurate and complete.''  Wende Mott, MD  01/14/2019 9:44 AM

## 2019-01-13 NOTE — Telephone Encounter
Forwarded by:  [place]

## 2019-01-14 ENCOUNTER — Ambulatory Visit: Payer: BLUE CROSS/BLUE SHIELD

## 2019-01-14 DIAGNOSIS — E063 Autoimmune thyroiditis: Secondary | ICD-10-CM

## 2019-01-14 DIAGNOSIS — H6691 Otitis media, unspecified, right ear: Secondary | ICD-10-CM

## 2019-01-14 DIAGNOSIS — Z8269 Family history of other diseases of the musculoskeletal system and connective tissue: Secondary | ICD-10-CM

## 2019-01-14 DIAGNOSIS — J309 Allergic rhinitis, unspecified: Secondary | ICD-10-CM

## 2019-01-14 MED ORDER — AMOXICILLIN-POT CLAVULANATE 875-125 MG PO TABS
1 | ORAL_TABLET | Freq: Two times a day (BID) | ORAL | 0 refills | Status: AC
Start: 2019-01-14 — End: ?

## 2019-01-14 MED ORDER — AZELASTINE-FLUTICASONE 137-50 MCG/ACT NA SUSP
1 | Freq: Two times a day (BID) | NASAL | 3 refills | Status: AC
Start: 2019-01-14 — End: ?

## 2019-01-15 LAB — Cancer Antigen 125: CA-125: 8 U/mL (ref 0–35)

## 2019-01-15 LAB — Estradiol: ESTRADIOL: 48 pg/mL

## 2019-01-15 LAB — CBC: MCH CONCENTRATION: 32.4 g/dL (ref 31.5–35.5)

## 2019-01-15 LAB — Follicle Stimulating Hormone: FSH: 30.4 m[IU]/mL

## 2019-01-15 LAB — Differential Automated: ABSOLUTE BASO COUNT: 0.02 10*3/uL (ref 0.00–0.10)

## 2019-01-17 ENCOUNTER — Ambulatory Visit: Payer: BLUE CROSS/BLUE SHIELD

## 2019-01-17 MED ORDER — AZELASTINE HCL 0.1 % NA SOLN
1 | Freq: Two times a day (BID) | NASAL | 3 refills | Status: AC
Start: 2019-01-17 — End: ?

## 2019-01-17 MED ORDER — FLUTICASONE PROPIONATE 50 MCG/ACT NA SUSP
1 | Freq: Every day | NASAL | 2 refills | Status: AC
Start: 2019-01-17 — End: ?

## 2019-01-19 ENCOUNTER — Telehealth: Payer: BLUE CROSS/BLUE SHIELD

## 2019-01-19 LAB — Luteinizing Hormone (LH-ECL): LUTEINIZING HORMONE (LH-ECL): 27 m[IU]/mL

## 2019-01-19 NOTE — Telephone Encounter
Called the pharmacy and informed the pharmacist the clarification for prescriptions, azelastine and fluticasone.

## 2019-01-19 NOTE — Telephone Encounter
Hi Dr. Lewie Chamber,     [facility] called to get clarification for the azelastine 137 mcg/ spray nasal spray and the fluticasone 50 mcg / act nasal spray. I informed the pharmacy that you sent the prescription electronically on 01/17/2019, but for some reason they wanted more clarification regarding the instructions for each.     Please advise.   [facility]   [phone]

## 2019-01-24 ENCOUNTER — Telehealth: Payer: PRIVATE HEALTH INSURANCE

## 2019-02-11 ENCOUNTER — Telehealth: Payer: PRIVATE HEALTH INSURANCE

## 2019-02-11 DIAGNOSIS — R1010 Upper abdominal pain, unspecified: Secondary | ICD-10-CM

## 2019-02-11 MED ORDER — OMEPRAZOLE 40 MG PO CPDR
40 mg | ORAL_CAPSULE | Freq: Every day | ORAL | 1 refills | Status: AC
Start: 2019-02-11 — End: 2019-04-28

## 2019-02-11 MED ORDER — FAMOTIDINE 10 MG PO TABS
10 mg | ORAL_TABLET | Freq: Two times a day (BID) | ORAL | 1 refills | Status: AC
Start: 2019-02-11 — End: 2019-04-28

## 2019-02-11 NOTE — Progress Notes
OUTPATIENT PROGRESS NOTE    Patient Consent to Telehealth Questionnaire   [facility] TELEHEALTH PRECHECKIN QUESTIONS 02/11/2019   By clicking ''I Agree'', I consent to the below:  I Agree     - I agree  to be treated via a video visit and acknowledge that I may be liable for any relevant copays or coinsurance depending on my insurance plan.  - I understand that this video visit is offered for my convenience and I am able to cancel and reschedule for an in-person appointment if I desire.  - I also acknowledge that sensitive medical information may be discussed during this video visit appointment and that it is my responsibility to locate myself in a location that ensures privacy to my own level of comfort.  - I also acknowledge that I should not be participating in a video visit in a way that could cause danger to myself or to those around me (such as driving or walking).  If my provider is concerned about my safety, I understand that they have the right to terminate the visit.       PATIENTAnnalecia Quinn  MRN: [id number]  DOB: [birth year]  DATE OF SERVICE: 02/11/2019    PRIMARY CARE PROVIDER: Elliot Gault., MD    CHIEF COMPLAINT  Chief Complaint   Patient presents with   ??? Hashimoto's Thyroiditis        HISTORY OF PRESENT ILLNESS      Janice Quinn is a 53 y.o. female who is calling     02/11/2019:    She was seen by dermatologist Dr. Abundio Miu due to her falling out of eyebrows. TSH, and TPA were checked, she was dx w/ Hashimoto's thyroiditis. A thyroid US was also ordered, revealed Normal thyroid US. Admits thinning of hair x years ago, her dermatologist rx Rogaine. - will order a few other thyroid tests, and possibly consider low dose hypothyroid medications.    Pt notes she wakes up in the middle night due to an achy pain in her chest, also wakes up feeling hungry. Admits uncomfortable to ly in bed, lasting 3-4 hrs. Admit yesterday s/p running felt ''a little out of breath'', today resolved. Denies loss of taste/smell, fever.         01/14/2019:    Situational Stress: pt notes she hasn't cried in two weeks, has been sleeping better. She changed jobs. The stress is manageable per pt.   ???  Pt c/o otalgia. On examination ROM.   ???  Fbs: 104  HgbA1C: 5.5  No DM  ???  EGFR: >89 no CKD  ???  WBC: low normal, no leukemia  RBC: no anemia  Hgb: 14.5  ???  Liver function's WNL  ???  UA normal   ???  Hashimoto's thyroiditis: 59.0  ???  - Pt notes she has increased red meat intake due to lack of poultry in stores.  ???  Total Cholesterol: 201                                     Framingham ratio: 2.87  HDL: 70  LDL: 122  Triglycerides: 47  -Spoke with patient about the correlation between Framingham ratio (Total Cholesterol/HDL) and heart disease. Suggested a more healthy diet and more exercise, especially walking after dinner.   ???  2018 ACC/AHA guidelines recommends that patient is not in statin benefit group. Encourage adherence to heart-healthy lifestyle.  10-year ASCVD risk  is 1.0%  as of 9:44 AM on 01/14/2019  10-year ASCVD risk with optimal risk factors is 0.8%.  Values used to calculate ASCVD score:  Age: 53 y.o.   Gender: Female          Race: Not African American.  HDL cholesterol: 70 mg/dL. HDL cholesterol measured 24 days ago.  Total cholesterol: 201 mg/dL. Total cholesterol measured 24 days ago.  LDL cholesterol: 122 mg/dL. LDL cholesterol measured 24 days ago.  Systolic BP: 120 mm Hg. BP was measured today.  The patient is not being treated with a medication that influences SBP.  The patient is currently not a smoker.  The patient does not have a diagnosis of diabetes.  ???  ???  Pt mentions when she is lying down she feels a burning sensation in mid epigastric region.   ???  US abdominal aorta screening non vascular 12/31/2018  ???  IMPRESSION:  ???  No evidence of abdominal aortic aneurysm.  ???  12/21/2018  ???  Health maintenance:  ??? Situational Work stress: Pt notes in September 2019 had emergency surgery for removal of an ovarian cyst. She reports not feeling like herself since then. She was out of work for 8 weeks, and upon her return she did not have any support causing her to work 18+hrs a day. Notes she finally hired someone and found out they were changing her position. She is inquiring about possible perimenopause???s/p surgery,???or attributes amenorrhea due to work related stress.???Admits intermittent earaches, and sore throat.  ???  Mammogram December 2019- due to cysts- ''fine per pt''  Cervical Pap smear 07/2018  HIV- deferred  ???  Fmhx: father had gout  ???  # EKG Reviewed  EKG performed on???12/21/2018, and personally reviewed shows:???Sinus Bradycardia, Minimal Voltage criteria for LVH, may be normal variant  - EKG results reviewed with pt.???        REVIEW OF SYSTEMS   (Check if unchanged, or note how changed)     [x]  Constitutional  [x]  Mental Status  [x]  Mood    [x]  Eyes  [x]  ENT  [x]  Cardiovascular    [x]  Resp  [x]  GI  [x]  GU    [x]  Neuro  [x]  Endo  []  Heme/Lymph    [x]  Musculoskeletal   [x]  Gait/balance  [x]  Cognition    [x]  Allergy/Immune  [x]  Skin       CHRONIC CONDITIONS  1.  []  Improved  []  Worsened  []  Stable   2.  []  Improved  []  Worsened  []  Stable   3.   []  Improved  []  Worsened  []  Stable     PAST MEDICAL HISTORY   Past Medical History:   Diagnosis Date   ??? Anxiety    ??? Degenerative arthritis        ALLERGIES  Allergies   Allergen Reactions   ??? Dust Mite Extract        MEDICATIONS  No outpatient medications have been marked as taking for the 02/11/19 encounter (Telemedicine) with Elliot Gault., MD.       PERTINENT SOCIAL HISTORY  Social History     Socioeconomic History   ??? Marital status: Married     Spouse name: Not on file   ??? Number of children: Not on file   ??? Years of education: Not on file   ??? Highest education level: Not on file   Occupational History   ??? 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 Food insecurity:     Worry: Not on file     Inability: Not on file   ??? Transportation needs:     Medical: Not on file     Non-medical: Not on file   Tobacco Use   ??? Smoking status: Never Smoker   ??? Smokeless tobacco: Never Used   Substance and Sexual Activity   ??? Alcohol use: No   ??? Drug use: No   ??? Sexual activity: Not on file   Lifestyle   ??? Physical activity:     Days per week: Not on file     Minutes per session: Not on file   ??? Stress: Not on file   Relationships   ??? Social connections:     Talks on phone: Not on file     Gets together: Not on file     Attends religious service: Not on file     Active member of club or organization: Not on file     Attends meetings of clubs or organizations: Not on file     Relationship status: Not on file   Other Topics Concern   ??? Not on file   Social History Narrative    Former smoker, in college for 2-3 years    Social drinking       PHYSICAL EXAM (Check if Normal, or note positive findings)  There were no vitals taken for this visit.  There were no vitals filed for this visit.   There is no height or weight on file to calculate BMI.    System Check if Normal Positive or additional negative findings   Constit  [x]  General appearance     Eyes  [x]  Conj/Lids [x]  Pupils  [x]  Fundi     ENMT  [x]  External ears/nose [x]  Otoscopy   [x]  Hearing [x]  Nasal mucosa   [x]  Lips/teeth/gums [x]  Oropharynx     Neck  [x]  Inspection/palpation [x]  Thyroid     Resp  [x]  Effort [x]  Percussion   [x]  Auscultation     CV  [x]  Auscultation [x]  Lower extremities    [x]  Abd aorta [x]  Femoral  [x]  Pedal     Breast  []  Inspection []  Palpation     GI  [x]  Abd (masses or tenderness)   [x]  Liver/spleen []  Rectal     GU  M: []  Scrotum []  Penis []  Prostate   F:  []  External []  Bladder []  Cervix        []  Uterus []  Adnexa      Lymph  [x]  Neck [x]  Axillae []  Groin     MS  [x]  Gait [x]  Digits/nails  Specify site examined:    [x]  Inspect/palp [x]  ROM [x]  Stability []  Strength/tone         Skin  [x]  Inspection [x]  Palpation     Neuro  [x]  CN2-12 [x]  DTRs  [x]  Sensation     Psych  [x]  Insight/judgement [x]  Orientation   [x]  Memory [x]  Mood/affect       RECENT LABS   {Choose one or more Last Lab Results, by Panel or Component. Press Delete if none desired:[id number]}    STUDIES                Recent Results (from the past 8760 hours)   US thyroid-parathyroid [IMG1070]    Status: Normal 01/24/2019  9:52 AM    Impression IMPRESSION:    Normal thyroid ultrasound.    I, Katrina  Romie Levee, M.D., have reviewed this radiological study personally and I am in full agreement with the findings of the report presented here.    Signed by: Quentin Cornwall   01/24/2019 9:52 AM          ASSESSMENT AND PLAN  Diagnoses and all orders for this visit:    Hashimoto's thyroiditis  -     Free T4; Future  -     Free T3; Future    Pain of upper abdomen  -     Lipase; Future  -     Amylase; Future  -     CA 19-9; Future  -     Comprehensive Metabolic Panel; Future  -     AFP; Future  -     [place] abd right upper quadrant non-vascular; Future    Other orders  -     omeprazole 40 mg DR capsule; Take 1 capsule (40 mg total) by mouth daily.  -     famotidine 10 mg tablet; Take 1 tablet (10 mg total) by mouth two (2) times daily.         Healthcare Maintenance  Health Maintenance   Topic Date Due   ??? Colon Ca Screening: COLONOSCOPY  11/06/2016   ??? Shingles (Shingrix) Vaccine (2 of 2) 02/15/2019   ??? Influenza Vaccine (Season Ended) 06/14/2019   ??? Annual Preventive Wellness Visit  12/21/2019   ??? Breast Ca Screening: MAMMOGRAM  09/24/2020   ??? Cervical Ca Screening: HPV Testing  07/30/2023   ??? Cervical Ca Screening: PAP Smear  07/30/2023   ??? Tdap/Td Vaccine (3 - Td) 12/20/2028   ??? HIV Screening  Discontinued       Immunizations   Immunization History   Administered Date(s) Administered   ??? Tdap 05/04/2017, 12/21/2018   ??? Typhoid Live, oral 05/04/2017   ??? zoster vac recomb adjuvanted (Shingrix) 12/21/2018 FOLLOW UP  No follow-ups on file.     Today's visit lasted more than *** minutes of which greater than 50% was spent in counseling, coordination of care and a detailed question and answer session regarding the pathophysiology, etiology, and treatment options available including the risks and benefits related to the medical issues pertinent to this encounter.  The date of encounter was when the patient was seen and does not reflect when the note was signed. The above plan of care, diagnosis, orders, and follow-up were discussed with the patient.  Questions related to this recommended plan of care were answered.      Scribe Signature:  ICorlis Leak, have scribed for Dr. Lewie Chamber with the documentation for Kia[facility] Denver in [facility] [place] on 02/11/2019 at 10:21 AM.    Performing provider statement: ''I Dr. Nils Pyle. Lewie Chamber, personally performed the services described in this documentation, as scribed by Corlis Leak in my presence, and it is both accurate and complete.''  Wende Mott, MD  02/11/2019 10:21 AM

## 2019-02-14 ENCOUNTER — Institutional Professional Consult (permissible substitution): Payer: PRIVATE HEALTH INSURANCE

## 2019-02-14 DIAGNOSIS — E063 Autoimmune thyroiditis: Secondary | ICD-10-CM

## 2019-02-14 DIAGNOSIS — R1010 Upper abdominal pain, unspecified: Secondary | ICD-10-CM

## 2019-02-15 LAB — Lipase: LIPASE: 40 U/L (ref 9–63)

## 2019-02-15 LAB — CA 19-9: CA 19-9: 5 U/mL (ref 0–35)

## 2019-02-15 LAB — Alpha- 1-Fetoprotein: ALPHA 1-FETOPROTEIN: 8.4 ng/mL — ABNORMAL HIGH (ref 0–6.7)

## 2019-02-15 LAB — Free T3: FREE T3: 239 pg/dL (ref 222–383)

## 2019-02-15 LAB — Comprehensive Metabolic Panel
ANION GAP: 11 mmol/L (ref 8–19)
GFR ESTIMATE FOR AFRICAN AMERICAN: >89 mL/min/{1.73_m2} (ref 0.1–1.2)
UREA NITROGEN: 20 mg/dL (ref 7–22)

## 2019-02-15 LAB — Free T4: FREE T4: 1 ng/dL (ref 0.8–1.7)

## 2019-02-15 LAB — Amylase: AMYLASE: 58 U/L (ref 31–124)

## 2019-04-11 ENCOUNTER — Ambulatory Visit: Payer: BLUE CROSS/BLUE SHIELD

## 2019-04-11 ENCOUNTER — Telehealth: Payer: BLUE CROSS/BLUE SHIELD

## 2019-04-11 DIAGNOSIS — E063 Autoimmune thyroiditis: Secondary | ICD-10-CM

## 2019-04-11 NOTE — Telephone Encounter
Referral Request    1) Patient is requesting a referral to:  Endocrinology       Specific location? [facility]    Particular MD in mind? Any     2) The issue (diagnosis, symptoms): Hashimoto     3) Has patient been seen by their doctor for this issue? Yes     4) Was an appointment offered? No    5) Patient's last office visit:     Patient has been notified of the 24-48 hour turnaround time.

## 2019-04-18 ENCOUNTER — Telehealth: Payer: BLUE CROSS/BLUE SHIELD

## 2019-04-18 NOTE — Progress Notes
PATIENTElara Quinn  MRN: [id number]  DOB: 16-Jan-1966  DATE OF SERVICE: 04/18/2019    REQUESTING PRACTITIONER: Elliot Gault., MD  PRIMARY CARE PROVIDER: Elliot Gault., MD    REASON FOR REFERRAL:  Hashimoto's thyroiditis    Chief Complaint   Patient presents with   ??? Establish Care   ??? Thyroid Problem         History of Present Illness:         Janice Quinn is a 53 y.o. female who presents for evaluation of Hashimoto's thyroiditis.    It was diagnosed in 2019.    Patient has family history of thyroid disorder: sister(s).    Current regimen:  -None    Patient clinically euthyroid without significant signs or symptoms of hypo-or hyperthyroidism.    Patient is complaining of falling 1/3 lateral of her eyebrows.          On 02/14/2019:  T4 free 1.0, T3 free 239  On 12/21/2018:  TSH 1.6, T4 free 1.3, TPO antibody 59    Lab Results   Component Value Date    TSH 1.6 12/21/2018    TSH 2.0 12/21/2017    TSH 1.7 12/14/2015    T4AUTO 1.0 02/14/2019    T4AUTO 1.3 12/21/2018    T4AUTO 1.2 12/21/2017    T3AUTO 239 02/14/2019    TPOAB 59.0 (H) 12/21/2018       Recent Results (from the past 45409 hours)   US thyroid-parathyroid [IMG1070]    Status: Normal 01/24/2019  9:52 AM    Narrative US THYROID PARATHYROID     CLINICAL HISTORY: elevated tpa 59.0.    COMPARISON: None.    TECHNIQUE: Utilizing high resolution ultrasound transducers, real time 2D sonography and limited color Doppler imaging of the neck was performed.    FINDINGS:      The right thyroid lobe measures 1.4 x 1.5 x 3.8 cm and the left thyroid lobe measures 1.2 x 1.4 x 4.2 cm. The isthmus measures 4 mm in AP dimension.      The background thyroid parenchyma is homogeneous. Blood flow in the gland is normal on color Doppler.    There are no discrete solid or cystic thyroid nodules.    No parathyroid nodule is identified.    No morphologically abnormal or enlarged cervical chain lymph nodes.         Past Medical History  Past Medical History:   Diagnosis Date ??? Anxiety    ??? Degenerative arthritis          Past Surgical History  Past Surgical History:   Procedure Laterality Date   ??? HYSTERECTOMY          Current Home Medications  Outpatient Medications Prior to Visit   Medication Sig Dispense Refill   ??? azelastine 137 mcg/spray nasal spray Spray 1 spray in each nare two (2) times daily. 1 bottle 3   ??? azelastine-fluticasone (DYMISTA) 137-50 mcg/act nasal spray 1 spray by Both Nares route two (2) times daily. 23 g 3   ??? fluticasone 50 mcg/act nasal spray Spray 1 spray by nasal route daily. 16 g 2   ??? meloxicam 15 mg tablet Take 1 tablet (15 mg total) by mouth daily. 30 tablet 1   ??? MOMETASONE 0.1% ointment APPLY EXTERNALLY TO THE AFFECTED AREA DAILY 45 g 0   ??? ALPRAZOLAM 0.25 mg tablet TAKE 1 TABLET BY MOUTH THREE TIMES DAILY AS NEEDED FOR ANXIETY (Patient not taking: Reported on 05/04/2017) 60 tablet 0   ???  famotidine 10 mg tablet Take 1 tablet (10 mg total) by mouth two (2) times daily. (Patient not taking: Reported on 04/18/2019.) 60 tablet 1   ??? gabapentin 300 mg capsule at bedtime.     ??? omeprazole 40 mg DR capsule Take 1 capsule (40 mg total) by mouth daily. (Patient not taking: Reported on 04/18/2019.) 30 capsule 1     No facility-administered medications prior to visit.          Allergies  Allergies   Allergen Reactions   ??? Dust Mite Extract         Family History  Family History   Problem Relation Age of Onset   ??? Hypertension Mother    ??? Breast cancer Mother          Social History  Social History     Socioeconomic History   ??? Marital status: Married     Spouse name: Not on file   ??? Number of children: Not on file   ??? Years of education: Not on file   ??? Highest education level: Not on file   Occupational History   ??? Not on file   Social Needs   ??? Financial resource strain: Not on file   ??? Food insecurity:     Worry: Not on file     Inability: Not on file   ??? Transportation needs:     Medical: Not on file     Non-medical: Not on file   Tobacco Use ??? Smoking status: Never Smoker   ??? Smokeless tobacco: Never Used   Substance and Sexual Activity   ??? Alcohol use: No   ??? Drug use: No   ??? Sexual activity: Not on file   Lifestyle   ??? Physical activity:     Days per week: Not on file     Minutes per session: Not on file   ??? Stress: Not on file   Relationships   ??? Social connections:     Talks on phone: Not on file     Gets together: Not on file     Attends religious service: Not on file     Active member of club or organization: Not on file     Attends meetings of clubs or organizations: Not on file     Relationship status: Not on file   Other Topics Concern   ??? Not on file   Social History Narrative    Former smoker, in college for 2-3 years    Social drinking         Review of Systems:   SYSTEMIC symptoms:   No fever, no chills, no night sweats, no recent weight change, no fatigue.  HEAD symptoms:   No headaches.  EYE symptoms:   No eyesight problems.  OTOLARYNGEAL symptoms:   No hearing loss.  CARDIOVASCULAR symptoms:   No chest pain or discomfort, no palpitations.  PULMONARY symptoms:   No dyspnea, no cough, no wheezing.  GASTROINTESTINAL symptoms:   No abdominal pain, no nausea, no vomiting, no melena, no hematochezia, no diarrhea, no constipation.  GENITOURINARY symptoms:   No dysuria, no frequency, no urgency, no hematuria  ENDOCRINE/METABOLIC symptoms:    As per history of present illness  SKIN symptoms:   No skin lesions, no rash, no pruritus.  MUSCULOSKELETAL symptoms:   No arthralgia, no muscle aches.  NEUROLOGY symptoms: No confusion, no syncope, no numbness or tingling.  PSYCHIATRIC symptoms:   No psychiatric symptoms.  Objective:   There were no vitals filed for this visit.  There is no height or weight on file to calculate BMI.                     Physical Exam:  GENERAL: Patient in no acute distress. Alert, well appearing, and in no distress  HEENT: PERRLA, EOMI, conjunctiva normal, TMs normal, oropharynx clear NECK: supple, no lymphadenopathy, normal thyroid.  LUNGS: Clear  HEART: RRR  ABDOMEN:  soft  EXTREMITIES: No edema  NEUROLOGIC: Alert and oriented, grossly intact. Alert and oriented, cranial nerves II-XII intact    Lab Tests/Studies:  Lab Results   Component Value Date    TSH 1.6 12/21/2018    TSH 2.0 12/21/2017    TSH 1.7 12/14/2015    T4AUTO 1.0 02/14/2019    T4AUTO 1.3 12/21/2018    T4AUTO 1.2 12/21/2017    T3AUTO 239 02/14/2019    TPOAB 59.0 (H) 12/21/2018       Imaging/Studies:  Recent Results (from the past 16109 hours)   US thyroid-parathyroid [IMG1070]    Status: Normal 01/24/2019  9:52 AM    Narrative US THYROID PARATHYROID     CLINICAL HISTORY: elevated tpa 59.0.    COMPARISON: None.    TECHNIQUE: Utilizing high resolution ultrasound transducers, real time 2D sonography and limited color Doppler imaging of the neck was performed.    FINDINGS:      The right thyroid lobe measures 1.4 x 1.5 x 3.8 cm and the left thyroid lobe measures 1.2 x 1.4 x 4.2 cm. The isthmus measures 4 mm in AP dimension.      The background thyroid parenchyma is homogeneous. Blood flow in the gland is normal on color Doppler.    There are no discrete solid or cystic thyroid nodules.    No parathyroid nodule is identified.    No morphologically abnormal or enlarged cervical chain lymph nodes.                    1. Hashimoto's thyroiditis  Free T3    Free T4    TSH    Thyroglobulin Antibody    Thyroid Peroxidase Antibody          Orders Placed This Encounter   ??? Free T3     Standing Status:   Future     Standing Expiration Date:   04/17/2020   ??? Free T4     Standing Status:   Future     Standing Expiration Date:   04/17/2020   ??? TSH     Standing Status:   Future     Standing Expiration Date:   04/17/2020   ??? Thyroglobulin Antibody     Standing Status:   Future     Standing Expiration Date:   04/17/2020   ??? Thyroid Peroxidase Antibody     Standing Status:   Future     Standing Expiration Date:   04/17/2020 Plan/ Recommendation/ Assessment:     Hashimoto's thyroiditis  Patient clinically stable and chemically is euthyroid.  Patient has positive TPO antibody.  No thyroid hormone replacement is indicated at this point.  Recommend to monitor periodically to start thyroid hormone replacement if indicated that point.    -Check TSH, Free T4, Free T3, Thyroglobulin Antibody, TPO before next visit      Return in 2 weeks or sooner if symptoms, question or concern        Author: Kathrynn Running 04/18/2019 4:26 PM

## 2019-04-19 ENCOUNTER — Ambulatory Visit: Payer: PRIVATE HEALTH INSURANCE

## 2019-04-19 ENCOUNTER — Institutional Professional Consult (permissible substitution): Payer: PRIVATE HEALTH INSURANCE

## 2019-04-19 DIAGNOSIS — E063 Autoimmune thyroiditis: Secondary | ICD-10-CM

## 2019-04-19 NOTE — Addendum Note
Addended by: Martyn Malay on: 04/19/2019 03:50 PM     Modules accepted: Orders

## 2019-04-20 LAB — Thyroglobulin Antibody: THYROGLOBULIN ANTIBODY: <0.9 [IU]/mL (ref <4.0)

## 2019-04-20 LAB — Free T4: FREE T4: 1.1 ng/dL (ref 0.8–1.7)

## 2019-04-20 LAB — Free T3: FREE T3: 233 pg/dL (ref 222–383)

## 2019-04-20 LAB — Thyroid Peroxidase Antibody: THYROID PEROXIDASE ANTIBODY: 38.7 [IU]/mL — ABNORMAL HIGH (ref <=20)

## 2019-04-20 LAB — TSH: TSH: 2 u[IU]/mL (ref 0.3–4.7)

## 2019-04-27 NOTE — Progress Notes
PATIENTEura Quinn  MRN: [id number]  DOB: 1966/09/05  DATE OF SERVICE: 04/28/2019    REQUESTING PRACTITIONER: No ref. provider found  PRIMARY CARE PROVIDER: Elliot Gault., MD    REASON FOR REFERRAL:  Hashimoto's thyroiditis    No chief complaint on file.        History of Present Illness:     Janice Quinn is a 53 y.o. female who presents for follow-up of Hashimoto's thyroiditis.    It was diagnosed in 2019.    Patient has family history of thyroid disorder: sister(s).    Current regimen:  -None    Patient clinically euthyroid without significant signs or symptoms of hypo-or hyperthyroidism.    Patient is complaining of falling 1/3 lateral of her eyebrows.    On 04/19/2019:  TSH 2.0, T4 free 1.1, T3 free 233, thyroglobulin antibody <0.9, TPO antibody 38.7  On 02/14/2019:  T4 free 1.0, T3 free 239  On 12/21/2018:  TSH 1.6, T4 free 1.3, TPO antibody 59    Lab Results   Component Value Date    TSH 2.0 04/19/2019    TSH 1.6 12/21/2018    TSH 2.0 12/21/2017    T4AUTO 1.1 04/19/2019    T4AUTO 1.0 02/14/2019    T4AUTO 1.3 12/21/2018    T3AUTO 233 04/19/2019    T3AUTO 239 02/14/2019    THYGLOBAB <0.9 04/19/2019    TPOAB 38.7 (H) 04/19/2019    TPOAB 59.0 (H) 12/21/2018       Recent Results (from the past 45409 hours)   US thyroid-parathyroid [IMG1070]    Status: Normal 01/24/2019  9:52 AM    Narrative US THYROID PARATHYROID     CLINICAL HISTORY: elevated tpa 59.0.    COMPARISON: None.    TECHNIQUE: Utilizing high resolution ultrasound transducers, real time 2D sonography and limited color Doppler imaging of the neck was performed.    FINDINGS:      The right thyroid lobe measures 1.4 x 1.5 x 3.8 cm and the left thyroid lobe measures 1.2 x 1.4 x 4.2 cm. The isthmus measures 4 mm in AP dimension.      The background thyroid parenchyma is homogeneous. Blood flow in the gland is normal on color Doppler.    There are no discrete solid or cystic thyroid nodules.    No parathyroid nodule is identified. No morphologically abnormal or enlarged cervical chain lymph nodes.         Past Medical History  Past Medical History:   Diagnosis Date   ??? Anxiety    ??? Degenerative arthritis          Past Surgical History  Past Surgical History:   Procedure Laterality Date   ??? HYSTERECTOMY          Current Home Medications  Outpatient Medications Prior to Visit   Medication Sig Dispense Refill   ??? azelastine 137 mcg/spray nasal spray Spray 1 spray in each nare two (2) times daily. 1 bottle 3   ??? azelastine-fluticasone (DYMISTA) 137-50 mcg/act nasal spray 1 spray by Both Nares route two (2) times daily. 23 g 3   ??? fluticasone 50 mcg/act nasal spray Spray 1 spray by nasal route daily. 16 g 2   ??? gabapentin 300 mg capsule at bedtime.     ??? meloxicam 15 mg tablet Take 1 tablet (15 mg total) by mouth daily. 30 tablet 1   ??? MOMETASONE 0.1% ointment APPLY EXTERNALLY TO THE AFFECTED AREA DAILY 45 g 0   ??? ALPRAZOLAM  0.25 mg tablet TAKE 1 TABLET BY MOUTH THREE TIMES DAILY AS NEEDED FOR ANXIETY (Patient not taking: Reported on 05/04/2017) 60 tablet 0   ??? famotidine 10 mg tablet Take 1 tablet (10 mg total) by mouth two (2) times daily. (Patient not taking: Reported on 04/18/2019.) 60 tablet 1   ??? omeprazole 40 mg DR capsule Take 1 capsule (40 mg total) by mouth daily. (Patient not taking: Reported on 04/18/2019.) 30 capsule 1     No facility-administered medications prior to visit.          Allergies  Allergies   Allergen Reactions   ??? Dust Mite Extract         Family History  Family History   Problem Relation Age of Onset   ??? Hypertension Mother    ??? Breast cancer Mother          Social History  Social History     Socioeconomic History   ??? Marital status: Married     Spouse name: Not on file   ??? Number of children: Not on file   ??? Years of education: Not on file   ??? Highest education level: Not on file   Occupational History   ??? Not on file   Social Needs   ??? Financial resource strain: Not on file   ??? Food insecurity:     Worry: Not on file Inability: Not on file   ??? Transportation needs:     Medical: Not on file     Non-medical: Not on file   Tobacco Use   ??? Smoking status: Never Smoker   ??? Smokeless tobacco: Never Used   Substance and Sexual Activity   ??? Alcohol use: No   ??? Drug use: No   ??? Sexual activity: Not on file   Lifestyle   ??? Physical activity:     Days per week: Not on file     Minutes per session: Not on file   ??? Stress: Not on file   Relationships   ??? Social connections:     Talks on phone: Not on file     Gets together: Not on file     Attends religious service: Not on file     Active member of club or organization: Not on file     Attends meetings of clubs or organizations: Not on file     Relationship status: Not on file   Other Topics Concern   ??? Not on file   Social History Narrative    Former smoker, in college for 2-3 years    Social drinking         Review of Systems:   SYSTEMIC symptoms:   No fever, no chills, no night sweats, no recent weight change, no fatigue.  HEAD symptoms:   No headaches.  EYE symptoms:   No eyesight problems.  OTOLARYNGEAL symptoms:   No hearing loss.  CARDIOVASCULAR symptoms:   No chest pain or discomfort, no palpitations.  PULMONARY symptoms:   No dyspnea, no cough, no wheezing.  GASTROINTESTINAL symptoms:   No abdominal pain, no nausea, no vomiting, no melena, no hematochezia, no diarrhea, no constipation.  GENITOURINARY symptoms:   No dysuria, no frequency, no urgency, no hematuria  ENDOCRINE/METABOLIC symptoms:    As per history of present illness  SKIN symptoms:   No skin lesions, no rash, no pruritus.  MUSCULOSKELETAL symptoms:   No arthralgia, no muscle aches.  NEUROLOGY symptoms: No confusion, no syncope, no numbness or  tingling.  PSYCHIATRIC symptoms:   No psychiatric symptoms.        Objective:   There were no vitals filed for this visit.  There is no height or weight on file to calculate BMI.                     Physical Exam: GENERAL: Patient in no acute distress. Alert, well appearing, and in no distress  HEENT: PERRLA, EOMI, conjunctiva normal, TMs normal, oropharynx clear  NECK: supple, no lymphadenopathy, normal thyroid.  LUNGS: Clear  HEART: RRR  ABDOMEN:  soft  EXTREMITIES: No edema  NEUROLOGIC: Alert and oriented, grossly intact. Alert and oriented, cranial nerves II-XII intact    Lab Tests/Studies:  Lab Results   Component Value Date    TSH 2.0 04/19/2019    TSH 1.6 12/21/2018    TSH 2.0 12/21/2017    T4AUTO 1.1 04/19/2019    T4AUTO 1.0 02/14/2019    T4AUTO 1.3 12/21/2018    T3AUTO 233 04/19/2019    T3AUTO 239 02/14/2019    THYGLOBAB <0.9 04/19/2019    TPOAB 38.7 (H) 04/19/2019    TPOAB 59.0 (H) 12/21/2018       Imaging/Studies:  Recent Results (from the past 45409 hours)   US thyroid-parathyroid [IMG1070]    Status: Normal 01/24/2019  9:52 AM    Narrative US THYROID PARATHYROID     CLINICAL HISTORY: elevated tpa 59.0.    COMPARISON: None.    TECHNIQUE: Utilizing high resolution ultrasound transducers, real time 2D sonography and limited color Doppler imaging of the neck was performed.    FINDINGS:      The right thyroid lobe measures 1.4 x 1.5 x 3.8 cm and the left thyroid lobe measures 1.2 x 1.4 x 4.2 cm. The isthmus measures 4 mm in AP dimension.      The background thyroid parenchyma is homogeneous. Blood flow in the gland is normal on color Doppler.    There are no discrete solid or cystic thyroid nodules.    No parathyroid nodule is identified.    No morphologically abnormal or enlarged cervical chain lymph nodes.                    1. Hashimoto's thyroiditis       No orders of the defined types were placed in this encounter.    Plan/ Recommendation/ Assessment:     Hashimoto's thyroiditis  Patient clinically stable and chemically is euthyroid.  Patient has positive TPO antibody.  No thyroid hormone replacement is indicated at this point.  Recommend to monitor periodically to start thyroid hormone replacement if indicated that point.  Recommend to follow-up with dermatologist for further evaluation of eyebrows falling    -Check TSH, Free T4 before next visit  -Recommend to follow-up with dermatology for further evaluation of eyebrows falling    Return in 4 months or sooner if symptoms, question or concern        Author: Kathrynn Running 04/27/2019 2:18 PM

## 2019-04-28 ENCOUNTER — Ambulatory Visit: Payer: BLUE CROSS/BLUE SHIELD

## 2019-05-19 ENCOUNTER — Ambulatory Visit: Payer: BLUE CROSS/BLUE SHIELD

## 2019-06-07 ENCOUNTER — Institutional Professional Consult (permissible substitution): Payer: PRIVATE HEALTH INSURANCE

## 2019-06-07 ENCOUNTER — Ambulatory Visit: Payer: BLUE CROSS/BLUE SHIELD

## 2019-06-07 DIAGNOSIS — Z23 Encounter for immunization: Secondary | ICD-10-CM

## 2019-07-20 ENCOUNTER — Ambulatory Visit: Payer: BLUE CROSS/BLUE SHIELD

## 2019-07-20 ENCOUNTER — Telehealth: Payer: BLUE CROSS/BLUE SHIELD

## 2019-07-20 DIAGNOSIS — J3089 Other allergic rhinitis: Secondary | ICD-10-CM

## 2019-07-20 DIAGNOSIS — Z20828 Contact with and (suspected) exposure to other viral communicable diseases: Secondary | ICD-10-CM

## 2019-07-20 DIAGNOSIS — K219 Gastro-esophageal reflux disease without esophagitis: Secondary | ICD-10-CM

## 2019-07-20 NOTE — Progress Notes
Patient Consent to Telehealth Questionnaire   [facility] TELEHEALTH PRECHECKIN QUESTIONS 07/20/2019   By clicking ''I Agree'', I consent to the below:  I Agree   - I agree  to be treated via a video visit and acknowledge that I may be liable for any relevant copays or coinsurance depending on my insurance plan.  - I understand that this video visit is offered for my convenience and I am able to cancel and reschedule for an in-person appointment if I desire.  - I also acknowledge that sensitive medical information may be discussed during this video visit appointment and that it is my responsibility to locate myself in a location that ensures privacy to my own level of comfort.  - I also acknowledge that I should not be participating in a video visit in a way that could cause danger to myself or to those around me (such as driving or walking).  If my provider is concerned about my safety, I understand that they have the right to terminate the visit.     [facility]  [address]  [place] [address]  Phone: [phone]  FAX: [phone]    TELEMEDICINE VISIT - URGENT CARE    SUBJECTIVE    CC: Cough    HPI: Janice Quinn is a 53 y.o. female with history of allergic rhinitis who is calling for   - cough  - patient was in [place] for vacation 1.5 weeks ago and rode on ATVs there and has been coughing since then  - reports dry cough only  - however, she is going to [place] next week and would like to get tested   - she went running and has been active  - no SOB, no fevers   - her cough has been intermittent   - she has been taking Dymista for her allergies  - patient lives with her husband who goes to work - works for [employer]   - no one in his group is positive   - patient reports she also has had some epigastric discomfort, occasional burning sensation   - not on medications for reflux - patient says Dr. Lewie Chamber told her she had GERD before, but she changed her diet and that improved     ROS:   Denies fevers, chills, sweats  Denies headache, visual changes  Denies chest pain, palpitations  Denies shortness of breath  Denies nausea, vomiting, or diarrhea    Allergies   Allergen Reactions   ??? Dust Mite Extract      Active Problem List Reviewed  Medications Reviewed    OBJECTIVE  PHYSICAL EXAMINATION  General: alert, well appearing, and in no distress.  Head: Atraumatic, normocephalic  Ears/Nose: normal external anatomy  Eyes: EOMI, conjunctiva normal  Resp: symmetrical chest rise, no increase work of breathing  CV: well perfused, no cyanosis  Psych:  Normal affect, insight, and judgment.    LABS/IMAGING:  I reviewed the most recent labs and imaging    ASSESSMENT/PLAN  Janice Quinn is a 53 y.o. female with:    1. Suspected COVID-19 virus infection  2. Cough  3. Gastroesophageal reflux disease, unspecified whether esophagitis present  4. Seasonal allergic rhinitis due to other allergic trigger  - COVID-19 PCR, Nasopharyngeal; Future  - advised symptomatic care, self-isolation/quarantine  - recommended trial antihistamine, decongestant, air humidifier, H2 blocker or PPI  - patient to contact PCP for GI referral as she has not had  colonoscopy yet   - ED/return precautions reviewed  - f/up with PCP     Bonnetta Barry, DO    Patient barriers to learning assessed: none.  Patient verbalized understanding of teaching and instructions.

## 2019-07-25 ENCOUNTER — Ambulatory Visit: Payer: PRIVATE HEALTH INSURANCE

## 2019-08-30 ENCOUNTER — Ambulatory Visit: Payer: PRIVATE HEALTH INSURANCE

## 2019-09-29 ENCOUNTER — Telehealth: Payer: BLUE CROSS/BLUE SHIELD

## 2019-09-29 NOTE — Telephone Encounter
Message to Practice/Provider    MD: Dr. Jomarie Longs    Message: pt would appreciate if the covid test that Dr. Jomarie Longs ordered a few months ago can be removed. The pt never got the test done but she was never sick with covid, the cough she had went away a few days after speak to Dr. Jomarie Longs. Pt would like to have labs done tomorrow.     Thank you.     Return call is not being requested by the patient or caller.    Patient or caller has been notified of the 24-48 hour processing turnaround time if applicable.

## 2019-09-30 NOTE — Telephone Encounter
Reply by: Charlean Sanfilippo  Done.

## 2019-09-30 NOTE — Addendum Note
Addended by: Johnathan Hausen on: 09/30/2019 08:17 AM     Modules accepted: Orders

## 2019-10-03 ENCOUNTER — Ambulatory Visit: Payer: BLUE CROSS/BLUE SHIELD

## 2019-10-04 ENCOUNTER — Institutional Professional Consult (permissible substitution): Payer: BLUE CROSS/BLUE SHIELD

## 2019-10-04 DIAGNOSIS — E063 Autoimmune thyroiditis: Secondary | ICD-10-CM

## 2019-10-04 NOTE — Addendum Note
Addended by: Martyn Malay on: 10/04/2019 03:04 PM     Modules accepted: Orders

## 2019-10-05 ENCOUNTER — Ambulatory Visit: Payer: BLUE CROSS/BLUE SHIELD

## 2019-10-05 LAB — TSH: TSH: 1.9 u[IU]/mL (ref 0.3–4.7)

## 2019-10-05 LAB — Free T4: FREE T4: 1.2 ng/dL (ref 0.8–1.7)

## 2019-10-11 ENCOUNTER — Ambulatory Visit: Payer: BLUE CROSS/BLUE SHIELD

## 2019-10-12 ENCOUNTER — Ambulatory Visit: Payer: BLUE CROSS/BLUE SHIELD

## 2019-11-17 ENCOUNTER — Ambulatory Visit: Payer: BLUE CROSS/BLUE SHIELD

## 2019-11-17 DIAGNOSIS — N3000 Acute cystitis without hematuria: Secondary | ICD-10-CM

## 2019-11-17 DIAGNOSIS — R350 Frequency of micturition: Secondary | ICD-10-CM

## 2019-11-17 MED ORDER — NITROFURANTOIN MONOHYD MACRO 100 MG PO CAPS
100 mg | ORAL_CAPSULE | Freq: Two times a day (BID) | ORAL | 0 refills | 5.00000 days | Status: AC
Start: 2019-11-17 — End: ?

## 2019-11-17 MED ORDER — NITROFURANTOIN MONOHYD MACRO 100 MG PO CAPS
100 mg | ORAL_CAPSULE | Freq: Two times a day (BID) | ORAL | 0 refills | 5.00000 days | Status: AC
Start: 2019-11-17 — End: 2019-11-18

## 2019-11-17 NOTE — Progress Notes
[facility] INTERNAL MEDICINE & PEDIATRICS  [facility]  [address]  [place] [address]  Phone: [phone]  FAX: [phone]    URGENT CARE - ADULT    Subjective:     CC: Urinary Tract Infection        HPI:   Janice Quinn is a 54 y.o. female   Has burning sensation and increased frequency for last 3 days   No fever   No chills   No flank pain     Review of Systems  Review of Systems   Constitutional: Negative for chills and fever.   Respiratory: Negative for cough, hemoptysis, sputum production and shortness of breath.    Cardiovascular: Negative for chest pain, palpitations and orthopnea.   Gastrointestinal: Negative for heartburn, nausea and vomiting.         Patient Active Problem List   Diagnosis   ? Sinusitis   ? Weight gain, abnormal   ? Lower urinary tract infectious disease   ? ROM (right otitis media)   ? Low back pain   ? Allergic rhinitis   ? Onychomycosis   ? Night sweats   ? Healthcare maintenance   ? Lumbar disc disease   ? Cough   ? Immunization deficiency   ? Breast lump in female   ? History of hysterectomy   ? Complex ovarian cyst   ? LVH (left ventricular hypertrophy)   ? Hashimoto's thyroiditis   ? Family history of gout   ? Abdominal pain   ? Falling hair       Past Medical History  She has a past medical history of Anxiety and Degenerative arthritis.    Medications/Supplements  No outpatient medications have been marked as taking for the 11/17/19 encounter (Office Visit) with Shelton Silvas., MD.       Objective:     Physical Exam  BP 126/85  ~ Pulse 64  ~ Temp 36.9 ?C (98.4 ?F) (Oral)  ~ Resp 16  ~ Wt 161 lb (73 kg)  ~ SpO2 99%  ~ BMI 26.79 kg/m?     Physical Exam   Cardiovascular: Normal rate and regular rhythm.   Pulmonary/Chest: Effort normal and breath sounds normal.           Assessment/Plan:     1. Acute cystitis without hematuria  Bacterial Culture Urine, Clean Catch nitrofurantoin, macrocrystal monohydrate, (MACROBID) 100 mg capsule    Bacterial Culture Urine, Clean Catch    DISCONTINUED: nitrofurantoin, macrocrystal monohydrate, (MACROBID) 100 mg capsule    CANCELED: Bacterial Culture Urine, Clean Catch   2. Frequent urination  POCT urinalysis dipstick    POCT urinalysis dipstick     She was suggested   1, hydration   2. Today the patient was provided a prescription/prescriptions. The indication for the prescription was discussed. Following were discussed in great detail.  1. Dosing schedule   2. Side effects   3. How to prevent over dosing   4. Suggested to keep the medications away from children         The above plan of care, diagnosis, orders, and follow-up were discussed with the patient.  Questions related to this recommended plan of care were answered.    Shelton Silvas, MD  11/17/2019 at 9:50 AM

## 2019-11-19 LAB — Bacterial Culture Urine: BACTERIAL CULTURE URINE: NO GROWTH {titer}

## 2020-01-17 ENCOUNTER — Ambulatory Visit: Payer: BLUE CROSS/BLUE SHIELD

## 2020-01-17 DIAGNOSIS — Z23 Encounter for immunization: Secondary | ICD-10-CM

## 2020-05-22 ENCOUNTER — Ambulatory Visit: Payer: PRIVATE HEALTH INSURANCE

## 2020-05-22 DIAGNOSIS — R350 Frequency of micturition: Secondary | ICD-10-CM

## 2020-05-22 DIAGNOSIS — N3000 Acute cystitis without hematuria: Secondary | ICD-10-CM

## 2020-05-22 MED ORDER — CIPROFLOXACIN HCL 250 MG PO TABS
250 mg | ORAL_TABLET | Freq: Two times a day (BID) | ORAL | 0 refills | Status: AC
Start: 2020-05-22 — End: ?

## 2020-05-22 MED ORDER — AZELASTINE-FLUTICASONE 137-50 MCG/ACT NA SUSP
NASAL | 2 refills | 60.00000 days
Start: 2020-05-22 — End: ?

## 2020-05-22 NOTE — Patient Instructions
Understanding Urinary Tract Infections (UTIs)   Most UTIs are caused by bacteria, but they may also be caused by viruses or fungi. Bacteria from the bowel are the most common source of infection.The infection may start because of any of the following:    Sexual activity.  During sex, bacteria can travel from the penis, vagina, or rectum into the urethra.   Bacteria outside the rectum getting into the urethra.  Bacteria on the skinoutside the rectum may travel into the urethra. This is more common in women since the rectum and urethra are closer to each other than in men. Wiping from front to back after using the toilet and keeping the area clean can help prevent germs from getting to the urethra.   Blocked urine flow through the urinary tract. If urine sits too long, germs may start to grow out of control.  Parts of the urinary tract  The infection canoccur in any part of the urinary tract.      The kidneys. These organs collect and store urine.   The ureters. These tubes carry urine from the kidneys to the bladder.   The bladder. This holds urine until you are ready to let it out.   The urethra. This tube carries urine from the bladder out of the body. It is shorter in women, so bacteria can move throughit more easily.The urethra is longer in men, so a UTI is less likely to reach the bladder or kidneys in men.  [employer] last reviewed this educational content on 10/13/2018   2000-2020 [employer]. All rights reserved. This information is not intended as a substitute for professional medical care. Always follow your healthcare professional's instructions.

## 2020-05-22 NOTE — Progress Notes
[facility] INTERNAL MEDICINE & PEDIATRICS  [facility]  [address] [place]  [address]  [place] [address]  Phone: [phone]  FAX: [phone]    URGENT CARE - ADULT    Subjective:     CC: Urinary Frequency        HPI:   Janice Quinn is a 54 y.o. female   She was here today with increased frequency /burning for 24 hrs   She was treated for a UTI in the recent past and her Urine culture did nt have a growth   Bu the medicine helped a lot    she denies any flank pain   No fever   No chills     Review of Systems  Review of Systems   Respiratory: Negative for cough, hemoptysis and sputum production.    Cardiovascular: Negative for chest pain, palpitations and orthopnea.   Genitourinary: Positive for dysuria and frequency.         Patient Active Problem List   Diagnosis   ??? Sinusitis   ??? Weight gain, abnormal   ??? Lower urinary tract infectious disease   ??? ROM (right otitis media)   ??? Low back pain   ??? Allergic rhinitis   ??? Onychomycosis   ??? Night sweats   ??? Healthcare maintenance   ??? Lumbar disc disease   ??? Cough   ??? Immunization deficiency   ??? Breast lump in female   ??? History of hysterectomy   ??? Complex ovarian cyst   ??? LVH (left ventricular hypertrophy)   ??? Hashimoto's thyroiditis   ??? Family history of gout   ??? Abdominal pain   ??? Falling hair       Past Medical History  She has a past medical history of Anxiety and Degenerative arthritis.    Medications/Supplements  No outpatient medications have been marked as taking for the 05/22/20 encounter (Office Visit) with Shelton Silvas., MD.       Objective:     Physical Exam  BP 118/78  ~ Pulse 64  ~ Temp 36.7 ???C (98 ???F)  ~ Resp 18  ~ SpO2 97%     Physical Exam   Constitutional: She is well-developed, well-nourished, and in no distress.   Cardiovascular: Normal rate, regular rhythm and normal heart sounds.   Pulmonary/Chest: Effort normal and breath sounds normal. No respiratory distress. She has no wheezes. She has no rales. Abdominal: She exhibits no distension. There is no abdominal tenderness. There is no rebound and no guarding.   No flank tenderness on percussion          Assessment/Plan:     1. Frequency of micturition  POCT urinalysis dipstick    Bacterial Culture Urine, Clean Catch    POCT urinalysis dipstick    Bacterial Culture Urine, Clean Catch    ciprofloxacin 250 mg tablet     She was placed on antibiotics   Culture re ordered     The above plan of care, diagnosis, orders, and follow-up were discussed with the patient.  Questions related to this recommended plan of care were answered.    Shelton Silvas, MD  05/22/2020 at 1:04 PM

## 2020-05-23 MED ORDER — AZELASTINE-FLUTICASONE 137-50 MCG/ACT NA SUSP
2 refills | Status: AC
Start: 2020-05-23 — End: ?

## 2020-05-24 LAB — Bacterial Culture Urine
BACTERIAL CULTURE URINE: >100000 — AB
BACTERIAL CULTURE URINE: >100000 — AB

## 2020-06-21 ENCOUNTER — Ambulatory Visit: Payer: BLUE CROSS/BLUE SHIELD

## 2020-08-08 ENCOUNTER — Ambulatory Visit: Payer: PRIVATE HEALTH INSURANCE

## 2020-08-08 DIAGNOSIS — Z0000 Encounter for general adult medical examination without abnormal findings: Secondary | ICD-10-CM

## 2020-08-08 DIAGNOSIS — L659 Nonscarring hair loss, unspecified: Secondary | ICD-10-CM

## 2020-08-08 NOTE — Progress Notes
PATIENTDeziray Quinn  MRN: [id number]  DOB: [birth year]  DATE OF SERVICE: 08/08/2020    Chief Complaint   Patient presents with   ??? Annual Exam     Past Medical History:   Diagnosis Date   ??? Anxiety    ??? Degenerative arthritis        HPI:   Janice Quinn is a very pleasant 54 y.o. female with the above history who presents for the following:     Mood  - Had an emergency surgery for ovary removal in 2019  - Went back to work and was very overwhelmed at work  - Since then, has been crying and feeling more emotional regarding specific topics  - Endorses eyebrows/hair thinning and dry skin/itchiness  - Pt is post-menopausal - does not want to take hormones  - Has not seen therapist since 20 years ago  - No changes in sleep    Eczema  - Uses Cetaphil as moisturizer and steroid cream   - Increased patches of dry skin  - Has a dermatologist but would like a referral to a new one    Routine Adult Health Maintenance  - Mammogram is scheduled for Dec 2021  - requesting COVID-19 antibody test    Elevated Blood Pressure:  - Currently stressed with move  - Does not have home blood pressure cuff  - Open to taking and logging home blood pressures daily after 10 min of rest  - Below 140/90    No chest pain, shortness of breathe, dizziness, syncope, presyncope, abdominal pain, bowel or bladder dysfunction, headache, weakness.      CHRONIC CONDITIONS     1.  []  Stable  []  Improved  []  Worsened   2.  []  Stable  []  Improved  []  Worsened   3.   []  Stable  []  Improved  []  Worsened     Past History:     Past Medical History:   Diagnosis Date   ??? Anxiety    ??? Degenerative arthritis      Past Surgical History:   Procedure Laterality Date   ??? HYSTERECTOMY         Medications:     Current Outpatient Medications   Medication Sig   ??? azelastine 137 mcg/spray nasal spray Spray 1 spray in each nare two (2) times daily.   ??? AZELASTINE-FLUTICASONE 137-50 mcg/act nasal spray SHAKE LIQUID AND USE IN EACH NOSTRIL TWICE DAILY   ??? ciclopirox 1% shampoo    ??? fluocinonide 0.05% external solution APPLY TOPICALLY TO SCALP QHS   ??? gabapentin 300 mg capsule Take 300 mg by mouth at bedtime Patient takes PRN.Marland Kitchen   ??? meloxicam 15 mg tablet Take 1 tablet (15 mg total) by mouth daily. (Patient taking differently: Take 15 mg by mouth daily Patient takes PRN.Marland Kitchen)   ??? MOMETASONE 0.1% ointment APPLY EXTERNALLY TO THE AFFECTED AREA DAILY     No current facility-administered medications for this visit.      Allergies   Allergen Reactions   ??? Dust Mite Extract        Family History:     Family History   Problem Relation Age of Onset   ??? Hypertension Mother    ??? Breast cancer Mother        Social History:     Social History     Tobacco Use   ??? Smoking status: Never Smoker   ??? Smokeless tobacco: Never Used   Substance Use Topics   ??? Alcohol  use: No     Social History     Social History Narrative    Former smoker, in college for 2-3 years    Social drinking      OB History    No obstetric history on file.       No LMP recorded. Patient has had a hysterectomy.  Social History     Substance and Sexual Activity   Sexual Activity Not on file       Review of Systems:   (Note + findings):     [x]  14-point ROS performed and is negative except as per HPI.    []  Not Obtainable:   []  Constit  []  Skin    []  Eyes  []  CV   []  MS    []  ENT  []  GI  []  Heme/Lymph     []  Resp  []  GU  []  Neuro    []  Imm/All   []  Endo  []  Psych       Physical Exam:   Check if Normal, or note + findings  Vit BP 144/102  ~ Pulse 59  ~ Temp 36.3 ???C (97.3 ???F) (Forehead)  ~ Resp 15  ~ Ht 5' 5'' (1.651 m)  ~ Wt 157 lb 6.4 oz (71.4 kg)  ~ SpO2 99%  ~ BMI 26.19 kg/m???    BP Readings from Last 3 Encounters:   08/08/20 144/102   05/22/20 118/78   11/17/19 126/85     Wt Readings from Last 3 Encounters:   08/08/20 157 lb 6.4 oz (71.4 kg)   11/17/19 161 lb (73 kg)   04/28/19 161 lb 6.4 oz (73.2 kg)      Const [x]  NAD  GI []  Abd  []  Rect []  HSM   Eyes []  Conj/Lids []  Pupils  []  Masses []  Guard/Rebnd   ENMT []  Ears/otosc    []  Oroph Healing lesion. , adjacent to the lateral commissure w/o pus or drainage   []  Nasal Muc  []  Hearing GU:Fem  QM:VHQI []  Ext   []  Vag      []  Pen  []  Scro    []  Cerv []  Uter/Ad []  Deferred  []  Prost []  Deferred   Neck []  Inspect/Palp   []  Thyroid Neuro [x]  A&O []  CN2-12   Breast []  Inspect   []  Palpation  []  DTR []  Motor/Sen   Resp [x]  Effort [x]  Ascult []  Percuss MSK [x]  Gait   []  ROM  []  Tone  []  Bal []  Insp/palp  []  Back   CV [x]  Auscultate []  Carotids       [x]  Edema Puls: [x]  Post Tib  Skin [x]  Inspect   []  Palp   Lymph []  Neck  []  Axillary  []   Femoral Psych [x]  Insight/judg  [x]  Affect [x]  Cog       Labs/Imaging:   I have   [] reviewed radiology,  [x] reviewed labs, [] reviewed diag med test, [x] reviewed & summarized old records, [] requested outside medical records.    Lab Results   Component Value Date    WBC 5.68 01/14/2019    HGB 13.8 01/14/2019    HCT 42.6 01/14/2019    MCV 92.4 01/14/2019    PLT 306 01/14/2019     Lab Results   Component Value Date    CREAT 0.75 02/14/2019    BUN 20 02/14/2019    NA 140 02/14/2019    K 4.5 02/14/2019    CL 101 02/14/2019    CO2 28 02/14/2019  Lab Results   Component Value Date    HGBA1C 5.5 12/21/2018     Lab Results   Component Value Date    ALT 10 02/14/2019    AST 19 02/14/2019    ALKPHOS 47 02/14/2019    BILITOT 0.2 02/14/2019     Lab Results   Component Value Date    TSH 1.9 10/04/2019     Lab Results   Component Value Date    CALCIUM 9.4 02/14/2019     Lab Results   Component Value Date    CHOL 201 12/21/2018    CHOLHDL 70 12/21/2018    CHOLDLCAL 122 (H) 12/21/2018    TRIGLY 47 12/21/2018       Assessment and Plan:   Janice Quinn is a very pleasant 54 y.o. female with:     Diagnoses and all orders for this visit:    Routine adult health maintenance  -     CBC & Auto Differential; Future  -     Comprehensive Metabolic Panel; Future  -     TSH; Future  -     Free T4; Future  -     Hgb A1c; Future  -     Lipid Panel; Future  -     Urinalysis,Routine; Future  - Bacterial Culture Urine; Future  -     COVID-19 Antibody Test (IgG); Future    Hair loss  -     Vitamin D,25-Hydroxy; Future  -     Vitamin B12; Future  -     Folate,Serum; Future  -     AFP; Future  -     Referral to Dermatology    Mood: Most likely a result of multiple factors including working from home, post-menopausal hormonal changes, and potential thyroid etiologies. TSH and Free T4 will elucidate next steps.         -     TSH, Free T4         -     Counseled pt about the benefits of therapy given her current stressors    Elevated Blood Pressure w/o dx of hypertension: Given elevated readings in office, pt to monitor at home daily.  - Counseled pt to get omron cuff  - Measure daily after resting for  - Take sitting down with arm relaxed    Patient Active Problem List    Diagnosis Date Noted   ??? Falling hair 04/28/2019   ??? Abdominal pain 02/11/2019   ??? Hashimoto's thyroiditis 01/14/2019   ??? Family history of gout 01/14/2019     Father had gout     ??? LVH (left ventricular hypertrophy) 12/21/2018   ??? History of hysterectomy 06/12/2018   ??? Complex ovarian cyst 06/12/2018   ??? Breast lump in female 10/07/2017   ??? Immunization deficiency 05/06/2017   ??? Healthcare maintenance 05/04/2017   ??? Lumbar disc disease 05/04/2017   ??? Cough 05/04/2017   ??? Night sweats 12/21/2015   ??? Onychomycosis 12/14/2015   ??? Allergic rhinitis 10/17/2014   ??? Low back pain 02/16/2014     IMO Update     ??? ROM (right otitis media) 10/17/2013   ??? Sinusitis 11/19/2012   ??? Weight gain, abnormal 11/19/2012   ??? Lower urinary tract infectious disease 11/19/2012     IMO Update         Health Care Maintenance:   Health Maintenance   Topic Date Due   ??? COVID-19 Vaccine (1) Never done   ???  Colorectal Cancer Screening  Never done   ??? Breast Ca Screening: MAMMOGRAM  09/24/2020   ??? Influenza Vaccine (1) 04/11/2021 (Originally 07/13/2020)   ??? Annual Preventive Wellness Visit  08/08/2021   ??? Cervical Ca Screening: HPV Testing  07/30/2023   ??? Cervical Ca Screening: PAP Smear  07/30/2023   ??? Tdap/Td Vaccine (3 - Td) 12/20/2028   ??? Hepatitis C Screening  Completed   ??? Shingles (Shingrix) Vaccine  Completed   ??? HIV Screening  Discontinued       Vaccinations   Immunization History   Administered Date(s) Administered   ??? Tdap 05/04/2017, 12/21/2018   ??? Typhoid Live, oral 05/04/2017   ??? zoster vac recomb adjuvanted (Shingrix) 12/21/2018, 06/07/2019       Cancer Screening  Colonoscopy ??? Age 55 (Current age: 54 y.o.)    Preventive Health  A1c ???   Lab Results   Component Value Date    HGBA1C 5.5 12/21/2018     Lipids - 2013 ACC/AHA guidelines recommend recommends that patient is not in statin benefit group. Encourage adherence to heart-healthy lifestyle.    10-year ASCVD risk  is 1.6% as of 11:17 AM on 08/08/2020  Values used to calculate ASCVD score:  Age: 54 y.o.   Gender: Female Race: Not African American.  HDL cholesterol: 70 mg/dL. HDL cholesterol measured on 12/21/2018.  Total cholesterol: 201 mg/dL. Total cholesterol measured on 12/21/2018.  LDL cholesterol: 122 mg/dL. LDL cholesterol measured on 12/21/2018.  Systolic BP: 144 mm Hg. BP was measured on 08/08/2020.  The patient is not being treated with a medication that influences SBP.  The patient is currently not a smoker.  The patient does not have a diagnosis of diabetes.  Lab Results   Component Value Date    CHOL 201 12/21/2018    CHOLHDL 70 12/21/2018    CHOLDLCAL 122 (H) 12/21/2018    TRIGLY 47 12/21/2018     HIV:     Hepatitis C:     There are no Patient Instructions on file for this visit.    The above recommendation were discussed with the patient.  The patient has all questions answered satisfactorily and is in agreement with this recommended plan of care.        Author:  Carlena Sax 08/08/2020           This note was authored by medical student, Carlena Sax, at [facility]. This note should not be consulted for clinical, billing, or medico-legal purposes unless a physician has reviewed this note and added an attestation below regarding the accuracy of above documentation.

## 2020-08-09 ENCOUNTER — Ambulatory Visit: Payer: BLUE CROSS/BLUE SHIELD

## 2020-08-09 DIAGNOSIS — R772 Abnormality of alphafetoprotein: Secondary | ICD-10-CM

## 2020-08-09 LAB — Lipid Panel: TRIGLYCERIDES: 90 mg/dL (ref <150)

## 2020-08-09 LAB — CBC: HEMOGLOBIN: 14.3 g/dL (ref 11.6–15.2)

## 2020-08-09 LAB — Free T4: FREE T4: 1.3 ng/dL (ref 0.80–1.70)

## 2020-08-09 LAB — COVID-19 Antibody IgG: COVID-19 IGG QL: NEGATIVE

## 2020-08-09 LAB — UA,Dipstick: GLUCOSE: NEGATIVE (ref 5.0–8.0)

## 2020-08-09 LAB — UA,Microscopic: WBCS: 1 {cells}/uL (ref 0–22)

## 2020-08-09 LAB — Vitamin B12: VITAMIN B12: 351 pg/mL (ref 254–1060)

## 2020-08-09 LAB — Folate,Serum: FOLATE,SERUM: 11.6 ng/mL (ref 8.1–30.4)

## 2020-08-09 LAB — TSH: TSH: 2.3 u[IU]/mL (ref 0.3–4.7)

## 2020-08-09 LAB — Comprehensive Metabolic Panel
GFR ESTIMATE FOR NON-AFRICAN AMERICAN: >89 mL/min/{1.73_m2} (ref 8.6–10.4)
UREA NITROGEN: 10 mg/dL (ref 7–22)

## 2020-08-09 LAB — Vitamin D,25-Hydroxy: VITAMIN D,25-HYDROXY: 25 ng/mL (ref 20–50)

## 2020-08-09 LAB — Differential Automated: ABSOLUTE EOS COUNT: 0.09 10*3/uL (ref 0.00–0.50)

## 2020-08-09 LAB — Hgb A1c: HGB A1C - HPLC: 5.8 — ABNORMAL HIGH (ref <5.7)

## 2020-08-09 LAB — Alpha- 1-Fetoprotein: ALPHA 1-FETOPROTEIN: 8.2 ng/mL — ABNORMAL HIGH (ref 0–6.7)

## 2020-08-10 ENCOUNTER — Ambulatory Visit: Payer: BLUE CROSS/BLUE SHIELD

## 2020-08-10 LAB — Bacterial Culture Urine: BACTERIAL CULTURE URINE: NO GROWTH {titer}

## 2020-08-16 ENCOUNTER — Ambulatory Visit: Payer: PRIVATE HEALTH INSURANCE

## 2020-08-16 DIAGNOSIS — Z0000 Encounter for general adult medical examination without abnormal findings: Secondary | ICD-10-CM

## 2020-09-21 MED ORDER — AZELASTINE-FLUTICASONE 137-50 MCG/ACT NA SUSP
2 refills | Status: AC
Start: 2020-09-21 — End: ?

## 2021-04-30 ENCOUNTER — Ambulatory Visit: Payer: BLUE CROSS/BLUE SHIELD

## 2023-10-04 IMAGING — MR MRI RIGHT FOOT WITHOUT CONTRAST
4 of 6 series · 17 of 40 positions shown · IV contrast (gadolinium)
Comparison: 09/25/2023 [facility] radiographs

________________________________________________________________________________________________ 
MRI RIGHT FOOT WITHOUT CONTRAST, 10/04/2023 [DATE]: 
CLINICAL INDICATION: Pain In Right Ankle And Joints Of Right Foot. Right hallux 
valgus repair.
TECHNIQUE: Multiplanar, multiecho position MR images of the right foot with 
attention to the midfoot and forefoot were performed without intravenous 
gadolinium enhancement. Patient was scanned on a 3T magnet

[Series 201: survey_mst · axial · 10.0mm · 0.78mm/px · z∈[-118,+149]mm · 2 of 15 slices shown]
[im 1/15]
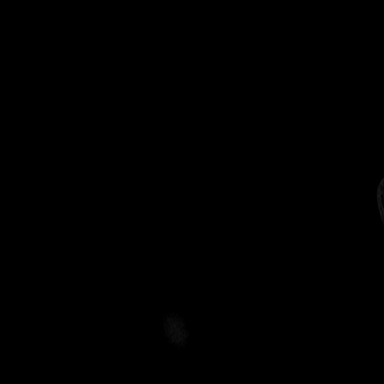
[im 15/15]
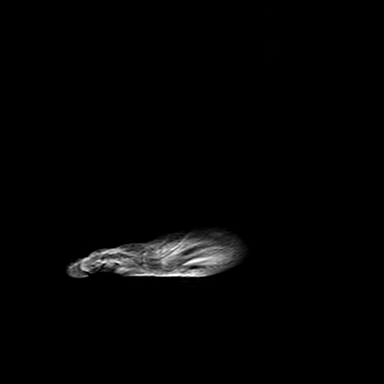

[Series 301: t1w_foot cs hr · sagittal · 2.5mm · 0.31mm/px · 3 of 32 slices shown]
[im 7/32]
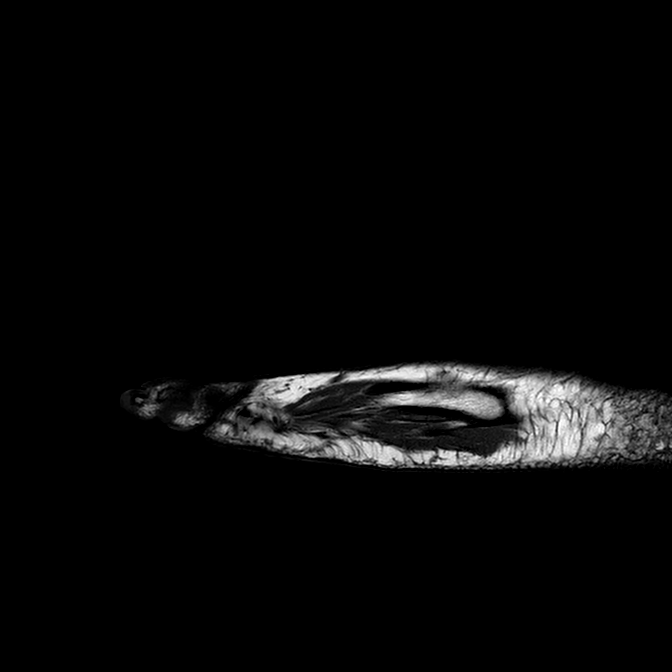
[im 19/32]
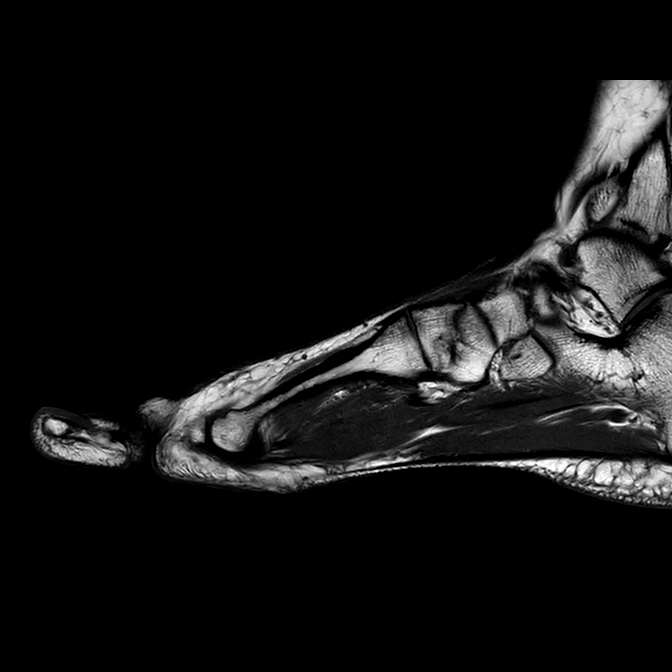
[im 32/32]
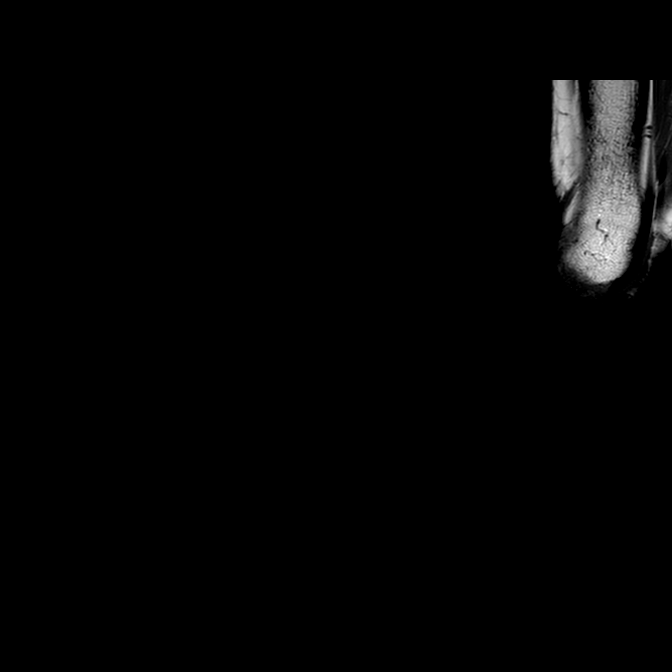

[Series 401: PD fat-sat · sagittal · 2.5mm · 0.33mm/px · 6 of 32 slices shown]
[im 1/32]
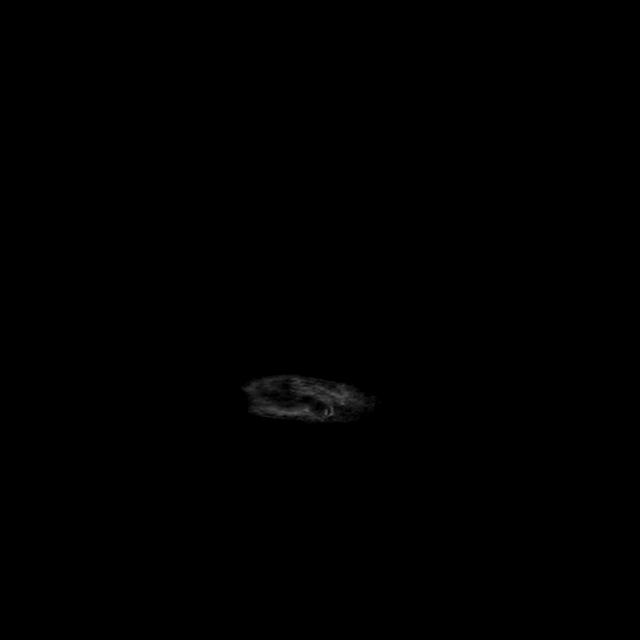
[im 7/32]
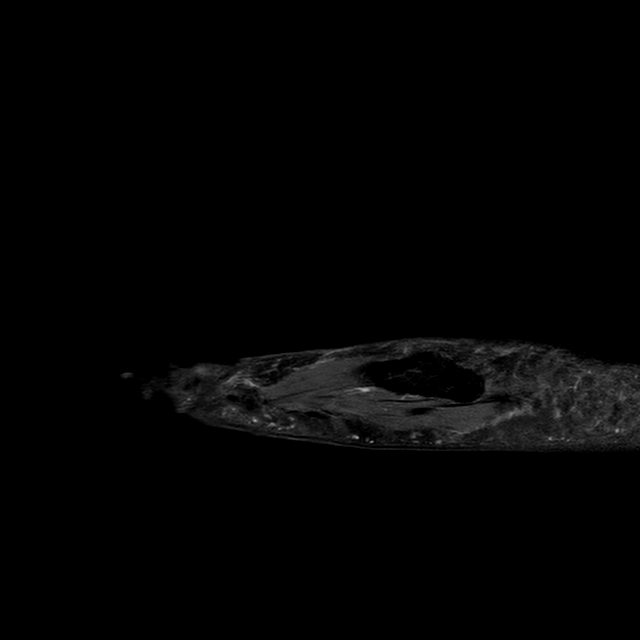
[im 13/32]
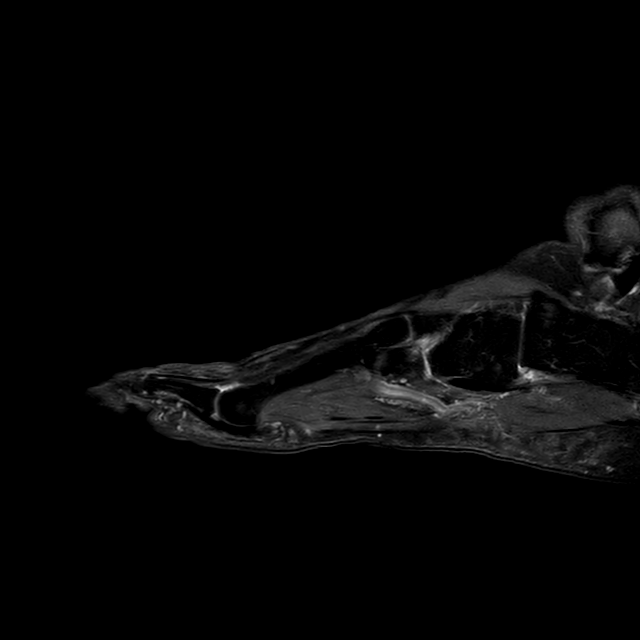
[im 19/32]
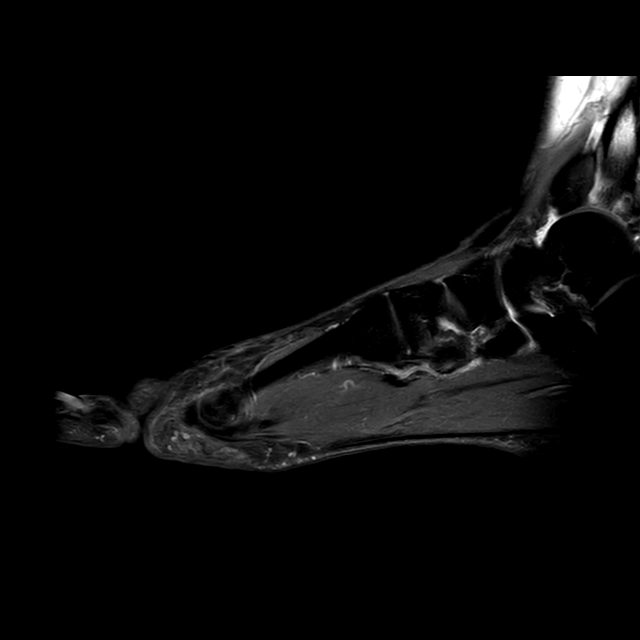
[im 25/32]
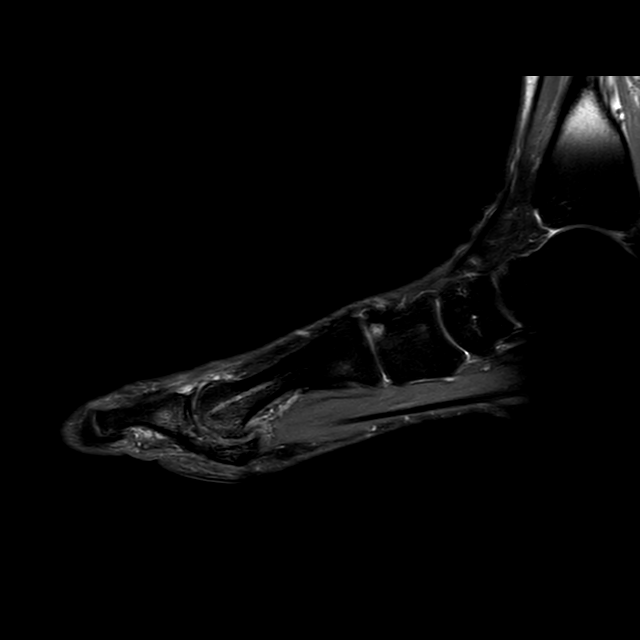
[im 32/32]
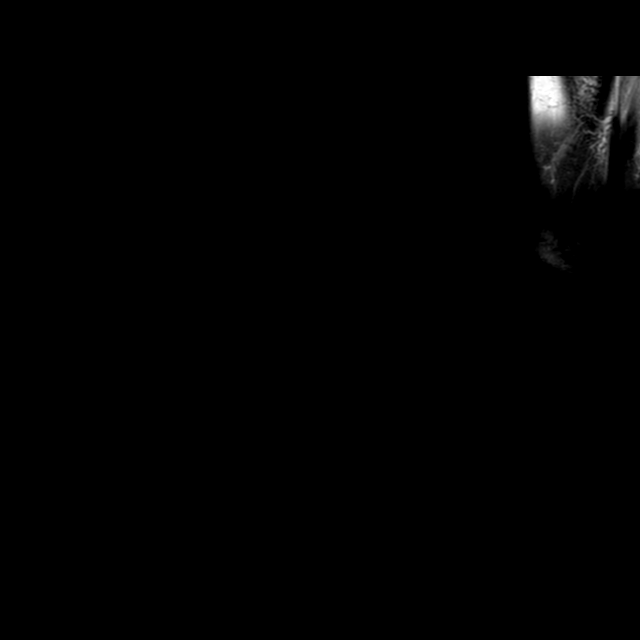

[Series 701: T1 · coronal · 3.0mm · 0.27mm/px · 6 of 56 slices shown]
[im 1/56]
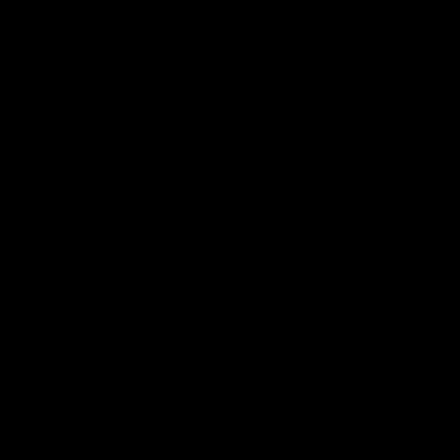
[im 7/56]
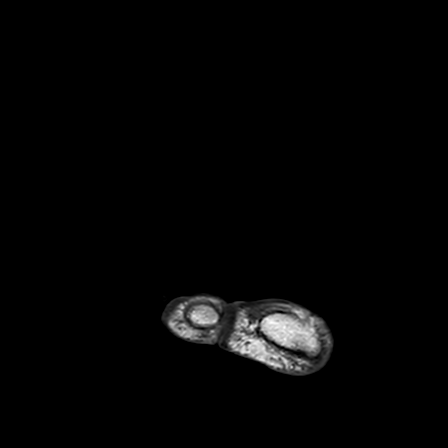
[im 19/56]
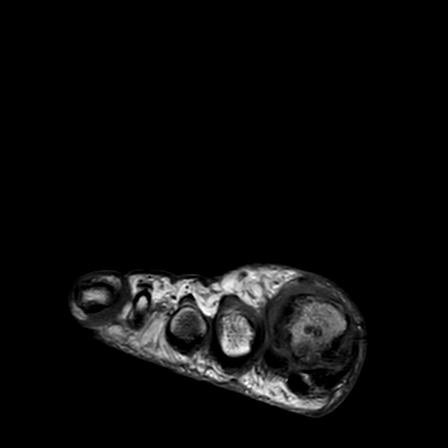
[im 25/56]
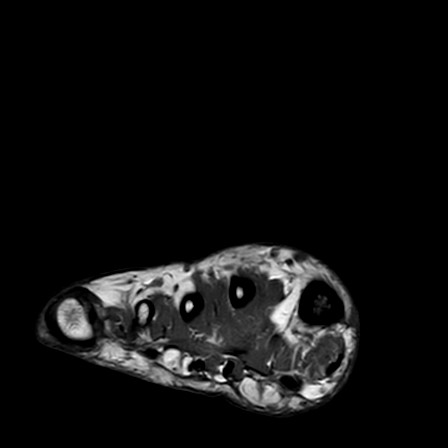
[im 31/56]
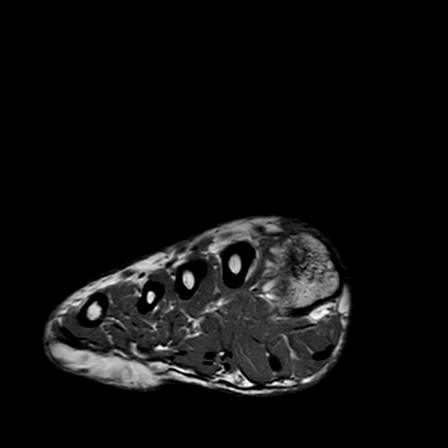
[im 49/56]
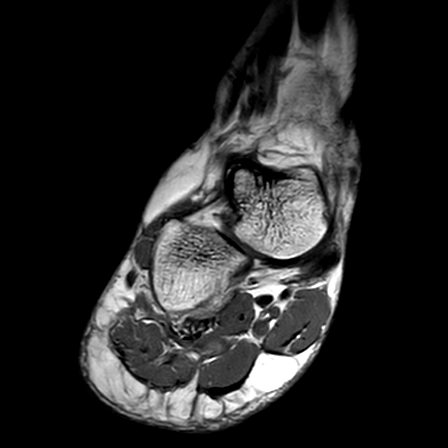

[17 of 40 positions shown; findings below may reference images not displayed]

FINDINGS: TENDONS: The imaged tendons are intact. 
SINUS TARSI LIGAMENTS: The cervical and interosseous ligaments are preserved. 
The inferior extensor retinaculum is intact.  
BONES AND JOINTS: Hallux valgus repair with healed, transfixed mid hallux 
metatarsal/proximal phalangeal base osteotomies, cheilectomy and metatarsal pin 
tract. Moderate hallux metatarsal-phalangeal (MTP) and mild hallux 
tarsometatarsal (TMT)/Interphalangeal (IP) joint degenerative change. 
Degenerative change of the hallux sesamoids. Decreased longitudinal arch of the 
forefoot. No fracture, contusion or stress response. The talar dome is 
preserved. No osteochondral lesion. The imaged portions of the ankle and 
subtalar joints are preserved.  
MUSCLES: Musculature is symmetric without mass, signal abnormality or atrophy.  
OTHER SOFT TISSUES: Postsurgical susceptibility artifact dorsal to the hallux 
TMT/MTP joints and metatarsal. Tarsal tunnel is preserved. Sinus tarsi fat is 
preserved. Plantar fascia is intact. No ulceration. No abscess.
IMPRESSION: 1.  Hallux valgus repair without identifiable complications.  
2.  Moderate hallux MTP/TMT/IP joint and hallux sesamoids degenerative change.  
3.  Decreased longitudinal arch of the forefoot.
